# Patient Record
Sex: Male | Born: 1940 | Race: White | Hispanic: No | Smoking: Current every day smoker
Health system: Southern US, Community
[De-identification: ages and names within clinical notes are randomized; demographics above are authoritative.]

## PROBLEM LIST (undated history)

## (undated) DIAGNOSIS — E119 Type 2 diabetes mellitus without complications: Secondary | ICD-10-CM

## (undated) DIAGNOSIS — C801 Malignant (primary) neoplasm, unspecified: Secondary | ICD-10-CM

## (undated) DIAGNOSIS — K219 Gastro-esophageal reflux disease without esophagitis: Secondary | ICD-10-CM

## (undated) DIAGNOSIS — K8020 Calculus of gallbladder without cholecystitis without obstruction: Secondary | ICD-10-CM

## (undated) DIAGNOSIS — Z87442 Personal history of urinary calculi: Secondary | ICD-10-CM

## (undated) DIAGNOSIS — E785 Hyperlipidemia, unspecified: Secondary | ICD-10-CM

## (undated) DIAGNOSIS — M1990 Unspecified osteoarthritis, unspecified site: Secondary | ICD-10-CM

## (undated) DIAGNOSIS — I2510 Atherosclerotic heart disease of native coronary artery without angina pectoris: Secondary | ICD-10-CM

## (undated) DIAGNOSIS — I10 Essential (primary) hypertension: Secondary | ICD-10-CM

## (undated) HISTORY — PX: EXTRACORPOREAL SHOCK WAVE LITHOTRIPSY: SHX1557

## (undated) HISTORY — PX: CARDIAC CATHETERIZATION: SHX172

---

## 1991-02-25 HISTORY — PX: CORONARY ANGIOPLASTY: SHX604

## 1991-02-25 HISTORY — PX: CORONARY ARTERY BYPASS GRAFT: SHX141

## 2006-01-01 ENCOUNTER — Ambulatory Visit (HOSPITAL_COMMUNITY): Admission: RE | Admit: 2006-01-01 | Discharge: 2006-01-02 | Payer: Self-pay | Admitting: Orthopedic Surgery

## 2006-02-24 HISTORY — PX: OTHER SURGICAL HISTORY: SHX169

## 2007-02-25 HISTORY — PX: OTHER SURGICAL HISTORY: SHX169

## 2010-10-30 DIAGNOSIS — Z9862 Peripheral vascular angioplasty status: Secondary | ICD-10-CM | POA: Insufficient documentation

## 2011-02-28 DIAGNOSIS — E1142 Type 2 diabetes mellitus with diabetic polyneuropathy: Secondary | ICD-10-CM | POA: Insufficient documentation

## 2012-10-06 DIAGNOSIS — I209 Angina pectoris, unspecified: Secondary | ICD-10-CM | POA: Insufficient documentation

## 2015-10-18 ENCOUNTER — Other Ambulatory Visit: Payer: Self-pay | Admitting: Surgical

## 2015-10-18 MED ORDER — BUPIVACAINE LIPOSOME 1.3 % IJ SUSP: 20.0000 mL | Freq: Once | INTRAMUSCULAR | Status: AC

## 2015-11-22 ENCOUNTER — Encounter (HOSPITAL_COMMUNITY): Payer: Self-pay

## 2015-11-22 ENCOUNTER — Encounter (HOSPITAL_COMMUNITY)
Admission: RE | Admit: 2015-11-22 | Discharge: 2015-11-22 | Disposition: A | Discharge status: Home / Self Care | Payer: Medicare Other | Source: Ambulatory Visit | Attending: Orthopedic Surgery | Admitting: Orthopedic Surgery

## 2015-11-22 DIAGNOSIS — I10 Essential (primary) hypertension: Secondary | ICD-10-CM | POA: Insufficient documentation

## 2015-11-22 DIAGNOSIS — E785 Hyperlipidemia, unspecified: Secondary | ICD-10-CM | POA: Insufficient documentation

## 2015-11-22 DIAGNOSIS — E119 Type 2 diabetes mellitus without complications: Secondary | ICD-10-CM | POA: Diagnosis not present

## 2015-11-22 DIAGNOSIS — Z951 Presence of aortocoronary bypass graft: Secondary | ICD-10-CM | POA: Insufficient documentation

## 2015-11-22 DIAGNOSIS — Z955 Presence of coronary angioplasty implant and graft: Secondary | ICD-10-CM | POA: Insufficient documentation

## 2015-11-22 DIAGNOSIS — K219 Gastro-esophageal reflux disease without esophagitis: Secondary | ICD-10-CM | POA: Diagnosis not present

## 2015-11-22 DIAGNOSIS — Z01812 Encounter for preprocedural laboratory examination: Secondary | ICD-10-CM | POA: Diagnosis not present

## 2015-11-22 DIAGNOSIS — I2510 Atherosclerotic heart disease of native coronary artery without angina pectoris: Secondary | ICD-10-CM | POA: Insufficient documentation

## 2015-11-22 DIAGNOSIS — Z87442 Personal history of urinary calculi: Secondary | ICD-10-CM | POA: Diagnosis not present

## 2015-11-22 HISTORY — DX: Gastro-esophageal reflux disease without esophagitis: K21.9

## 2015-11-22 HISTORY — DX: Calculus of gallbladder without cholecystitis without obstruction: K80.20

## 2015-11-22 HISTORY — DX: Personal history of urinary calculi: Z87.442

## 2015-11-22 HISTORY — DX: Unspecified osteoarthritis, unspecified site: M19.90

## 2015-11-22 HISTORY — DX: Malignant (primary) neoplasm, unspecified: C80.1

## 2015-11-22 HISTORY — DX: Type 2 diabetes mellitus without complications: E11.9

## 2015-11-22 HISTORY — DX: Essential (primary) hypertension: I10

## 2015-11-22 HISTORY — DX: Hyperlipidemia, unspecified: E78.5

## 2015-11-22 HISTORY — DX: Atherosclerotic heart disease of native coronary artery without angina pectoris: I25.10

## 2015-11-22 LAB — COMPREHENSIVE METABOLIC PANEL
ALT: 19 U/L (ref 17–63)
AST: 23 U/L (ref 15–41)
Albumin: 4.5 g/dL (ref 3.5–5.0)
Alkaline Phosphatase: 61 U/L (ref 38–126)
Anion gap: 7 (ref 5–15)
BUN: 9 mg/dL (ref 6–20)
CO2: 27 mmol/L (ref 22–32)
Calcium: 9.8 mg/dL (ref 8.9–10.3)
Chloride: 105 mmol/L (ref 101–111)
Creatinine, Ser: 0.63 mg/dL (ref 0.61–1.24)
GFR calc Af Amer: >60 mL/min (ref >60)
GFR calc non Af Amer: >60 mL/min (ref >60)
Glucose, Bld: 101 mg/dL — ABNORMAL HIGH (ref 65–99)
Potassium: 4.8 mmol/L (ref 3.5–5.1)
Sodium: 139 mmol/L (ref 135–145)
Total Bilirubin: 0.7 mg/dL (ref 0.3–1.2)
Total Protein: 7.7 g/dL (ref 6.5–8.1)

## 2015-11-22 LAB — CBC WITH DIFFERENTIAL/PLATELET
Basophils Absolute: 0 10*3/uL (ref 0.0–0.1)
Basophils Relative: 0 %
Eosinophils Absolute: 0.1 10*3/uL (ref 0.0–0.7)
Eosinophils Relative: 1 %
HCT: 40.1 % (ref 39.0–52.0)
Hemoglobin: 13.9 g/dL (ref 13.0–17.0)
Lymphocytes Relative: 20 %
Lymphs Abs: 1.7 10*3/uL (ref 0.7–4.0)
MCH: 31.4 pg (ref 26.0–34.0)
MCHC: 34.7 g/dL (ref 30.0–36.0)
MCV: 90.7 fL (ref 78.0–100.0)
Monocytes Absolute: 1.4 10*3/uL — ABNORMAL HIGH (ref 0.1–1.0)
Monocytes Relative: 16 %
Neutro Abs: 5.3 10*3/uL (ref 1.7–7.7)
Neutrophils Relative %: 63 %
Platelets: 186 10*3/uL (ref 150–400)
RBC: 4.42 MIL/uL (ref 4.22–5.81)
RDW: 15.8 % — ABNORMAL HIGH (ref 11.5–15.5)
WBC: 8.5 10*3/uL (ref 4.0–10.5)

## 2015-11-22 LAB — URINE MICROSCOPIC-ADD ON
Bacteria, UA: NONE SEEN
RBC / HPF: NONE SEEN RBC/hpf (ref 0–5)

## 2015-11-22 LAB — URINALYSIS, ROUTINE W REFLEX MICROSCOPIC
Bilirubin Urine: NEGATIVE
Glucose, UA: 100 mg/dL — AB
Hgb urine dipstick: NEGATIVE
Ketones, ur: NEGATIVE mg/dL
Nitrite: NEGATIVE
Protein, ur: NEGATIVE mg/dL
Specific Gravity, Urine: 1.012 (ref 1.005–1.030)
pH: 6 (ref 5.0–8.0)

## 2015-11-22 LAB — PROTIME-INR
INR: 0.97
Prothrombin Time: 12.9 seconds (ref 11.4–15.2)

## 2015-11-22 LAB — ABO/RH: ABO/RH(D): A POS

## 2015-11-22 LAB — APTT: aPTT: 30 seconds (ref 24–36)

## 2015-11-22 NOTE — Progress Notes (Addendum)
EKG 04-26-15 [facility] ON CHART  STRESS TEST 11-09-15 [facility] ON CHART  LOV DR  POWERS CARDIO 04-26-15 ON CHART  CARDIAC CLEARANCE NOTE DR POWERS 10-15-15 ON CHART ECHO 11-09-14 [facility] ON CHART

## 2015-11-22 NOTE — Patient Instructions (Addendum)
Carl Sutton  11/22/2015   Your procedure is scheduled on: 11-28-15  Report to [facility] Main  Entrance take [facility]  elevators to 3rd floor to  [place] at 630  AM.  Call this number if you have problems the morning of surgery [phone]   Remember: ONLY 1 PERSON MAY GO WITH YOU TO SHORT STAY TO GET  READY MORNING OF [name].  Do not eat food or drink liquids :After Midnight.     Take these medicines the morning of surgery with A SIP OF WATER:  METOPROLOL TARTRATE, OMEPRAZOLE (PRILOSEC), TAKE 1/2 DOSE PM LEVEMIR INSULIN ON 11-27-15 DO NOT TAKE ANY DIABETIC MEDICATIONS DAY OF YOUR SURGERY                               You may not have any metal on your body including hair pins and              piercings  Do not wear jewelry, make-up, lotions, powders or perfumes, deodorant             Do not wear nail polish.  Do not shave  48 hours prior to surgery.              Men may shave face and neck.   Do not bring valuables to the hospital. [place].  Contacts, dentures or bridgework may not be worn into surgery.  Leave suitcase in the car. After surgery it may be brought to your room.                  Please read over the following fact sheets you were given: _____________________________________________________________________             [facility] - Preparing for Surgery Before surgery, you can play an important role.  Because skin is not sterile, your skin needs to be as free of germs as possible.  You can reduce the number of germs on your skin by washing with CHG (chlorahexidine gluconate) soap before surgery.  CHG is an antiseptic cleaner which kills germs and bonds with the skin to continue killing germs even after washing. Please DO NOT use if you have an allergy to CHG or antibacterial soaps.  If your skin becomes reddened/irritated stop using the CHG and inform your nurse when you  arrive at Short Stay. Do not shave (including legs and underarms) for at least 48 hours prior to the first CHG shower.  You may shave your face/neck. Please follow these instructions carefully:  1.  Shower with CHG Soap the night before surgery and the  morning of Surgery.  2.  If you choose to wash your hair, wash your hair first as usual with your  normal  shampoo.  3.  After you shampoo, rinse your hair and body thoroughly to remove the  shampoo.                           4.  Use CHG as you would any other liquid soap.  You can apply chg directly  to the skin and wash  Gently with a scrungie or clean washcloth.  5.  Apply the CHG Soap to your body ONLY FROM THE NECK DOWN.   Do not use on face/ open                           Wound or open sores. Avoid contact with eyes, ears mouth and genitals (private parts).                       Wash face,  Genitals (private parts) with your normal soap.             6.  Wash thoroughly, paying special attention to the area where your surgery  will be performed.  7.  Thoroughly rinse your body with warm water from the neck down.  8.  DO NOT shower/wash with your normal soap after using and rinsing off  the CHG Soap.                9.  Pat yourself dry with a clean towel.            10.  Wear clean pajamas.            11.  Place clean sheets on your bed the night of your first shower and do not  sleep with pets. Day of Surgery : Do not apply any lotions/deodorants the morning of surgery.  Please wear clean clothes to the hospital/surgery center.  FAILURE TO FOLLOW THESE INSTRUCTIONS MAY RESULT IN THE CANCELLATION OF YOUR SURGERY PATIENT SIGNATURE_________________________________  NURSE SIGNATURE__________________________________  ________________________________________________________________________   [name]  An incentive spirometer is a tool that can help keep your lungs clear and active. This tool measures how  well you are filling your lungs with each breath. Taking long deep breaths may help reverse or decrease the chance of developing breathing (pulmonary) problems (especially infection) following:  A long period of time when you are unable to move or be active. BEFORE THE PROCEDURE   If the spirometer includes an indicator to show your best effort, your nurse or respiratory therapist will set it to a desired goal.  If possible, sit up straight or lean slightly forward. Try not to slouch.  Hold the incentive spirometer in an upright position. INSTRUCTIONS FOR USE  1. Sit on the edge of your bed if possible, or sit up as far as you can in bed or on a chair. 2. Hold the incentive spirometer in an upright position. 3. Breathe out normally. 4. Place the mouthpiece in your mouth and seal your lips tightly around it. 5. Breathe in slowly and as deeply as possible, raising the piston or the ball toward the top of the column. 6. Hold your breath for 3-5 seconds or for as long as possible. Allow the piston or ball to fall to the bottom of the column. 7. Remove the mouthpiece from your mouth and breathe out normally. 8. Rest for a few seconds and repeat Steps 1 through 7 at least 10 times every 1-2 hours when you are awake. Take your time and take a few normal breaths between deep breaths. 9. The spirometer may include an indicator to show your best effort. Use the indicator as a goal to work toward during each repetition. 10. After each set of 10 deep breaths, practice coughing to be sure your lungs are clear. If you have an incision (the cut made at the time of surgery),  support your incision when coughing by placing a pillow or rolled up towels firmly against it. Once you are able to get out of bed, walk around indoors and cough well. You may stop using the incentive spirometer when instructed by your caregiver.  RISKS AND COMPLICATIONS  Take your time so you do not get dizzy or light-headed.  If you  are in pain, you may need to take or ask for pain medication before doing incentive spirometry. It is harder to take a deep breath if you are having pain. AFTER USE  Rest and breathe slowly and easily.  It can be helpful to keep track of a log of your progress. Your caregiver can provide you with a simple table to help with this. If you are using the spirometer at home, follow these instructions: [place] IF:   You are having difficultly using the spirometer.  You have trouble using the spirometer as often as instructed.  Your pain medication is not giving enough relief while using the spirometer.  You develop fever of 100.5 F (38.1 C) or higher. SEEK IMMEDIATE MEDICAL CARE IF:   You cough up bloody sputum that had not been present before.  You develop fever of 102 F (38.9 C) or greater.  You develop worsening pain at or near the incision site. MAKE SURE YOU:   Understand these instructions.  Will watch your condition.  Will get help right away if you are not doing well or get worse. Document Released: 06/23/2006 Document Revised: 05/05/2011 Document Reviewed: 08/24/2006 ExitCare Patient Information 2014 ExitCare, [place].   ________________________________________________________________________  WHAT IS A BLOOD TRANSFUSION? Blood Transfusion Information  A transfusion is the replacement of blood or some of its parts. Blood is made up of multiple cells which provide different functions.  Red blood cells carry oxygen and are used for blood loss replacement.  White blood cells fight against infection.  Platelets control bleeding.  Plasma helps clot blood.  Other blood products are available for specialized needs, such as hemophilia or other clotting disorders. BEFORE THE TRANSFUSION  Who gives blood for transfusions?   Healthy volunteers who are fully evaluated to make sure their blood is safe. This is blood bank blood. Transfusion therapy is the safest  it has ever been in the practice of medicine. Before blood is taken from a donor, a complete history is taken to make sure that person has no history of diseases nor engages in risky social behavior (examples are intravenous drug use or sexual activity with multiple partners). The donor's travel history is screened to minimize risk of transmitting infections, such as malaria. The donated blood is tested for signs of infectious diseases, such as HIV and hepatitis. The blood is then tested to be sure it is compatible with you in order to minimize the chance of a transfusion reaction. If you or a relative donates blood, this is often done in anticipation of surgery and is not appropriate for emergency situations. It takes many days to process the donated blood. RISKS AND COMPLICATIONS Although transfusion therapy is very safe and saves many lives, the main dangers of transfusion include:   Getting an infectious disease.  Developing a transfusion reaction. This is an allergic reaction to something in the blood you were given. Every precaution is taken to prevent this. The decision to have a blood transfusion has been considered carefully by your caregiver before blood is given. Blood is not given unless the benefits outweigh the risks. AFTER THE TRANSFUSION  Right after receiving a blood transfusion, you will usually feel much better and more energetic. This is especially true if your red blood cells have gotten low (anemic). The transfusion raises the level of the red blood cells which carry oxygen, and this usually causes an energy increase.  The nurse administering the transfusion will monitor you carefully for complications. HOME CARE INSTRUCTIONS  No special instructions are needed after a transfusion. You may find your energy is better. Speak with your caregiver about any limitations on activity for underlying diseases you may have. SEEK MEDICAL CARE IF:   Your condition is not improving after  your transfusion.  You develop redness or irritation at the intravenous (IV) site. SEEK IMMEDIATE MEDICAL CARE IF:  Any of the following symptoms occur over the next 12 hours:  Shaking chills.  You have a temperature by mouth above 102 F (38.9 C), not controlled by medicine.  Chest, back, or muscle pain.  People around you feel you are not acting correctly or are confused.  Shortness of breath or difficulty breathing.  Dizziness and fainting.  You get a rash or develop hives.  You have a decrease in urine output.  Your urine turns a dark color or changes to pink, red, or brown. Any of the following symptoms occur over the next 10 days:  You have a temperature by mouth above 102 F (38.9 C), not controlled by medicine.  Shortness of breath.  Weakness after normal activity.  The white part of the eye turns yellow (jaundice).  You have a decrease in the amount of urine or are urinating less often.  Your urine turns a dark color or changes to pink, red, or brown. Document Released: 02/08/2000 Document Revised: 05/05/2011 Document Reviewed: 09/27/2007 [facility] Patient Information 2014 [place], [place].  _______________________________________________________________________

## 2015-11-23 ENCOUNTER — Other Ambulatory Visit (HOSPITAL_COMMUNITY): Payer: Self-pay | Admitting: *Deleted

## 2015-11-23 LAB — HEMOGLOBIN A1C
Hgb A1c MFr Bld: 6.5 % — ABNORMAL HIGH (ref 4.8–5.6)
Mean Plasma Glucose: 140 mg/dL

## 2015-11-23 LAB — SURGICAL PCR SCREEN
MRSA, PCR: NEGATIVE
Staphylococcus aureus: NEGATIVE

## 2015-11-27 NOTE — H&P (Signed)
TOTAL KNEE ADMISSION H&P  Patient is being admitted for right total knee arthroplasty.  Subjective:  Chief Complaint:right knee pain.  HPI: Carl Sutton, 75 y.o. male, has a history of pain and functional disability in the right knee due to arthritis and has failed non-surgical conservative treatments for greater than 12 weeks to includeNSAID's and/or analgesics, corticosteriod injections, viscosupplementation injections, weight reduction as appropriate and activity modification.  Onset of symptoms was gradual, starting 5 years ago with gradually worsening course since that time. The patient noted prior procedures on the knee to include  arthroscopy and menisectomy on the right knee(s).  Patient currently rates pain in the right knee(s) at 8 out of 10 with activity. Patient has night pain, worsening of pain with activity and weight bearing, pain that interferes with activities of daily living, pain with passive range of motion, crepitus and joint swelling.  Patient has evidence of periarticular osteophytes and joint space narrowing by imaging studies. There is no active infection.   Past Medical History:  Diagnosis Date  . Arthritis    oa  . Cancer ([place])    skin cancer area removed basal cell  . Coronary artery disease   . Diabetes mellitus without complication ([place])   . Gallstones   . GERD (gastroesophageal reflux disease)   . History of kidney stones yrs ago  . Hyperlipidemia   . Hypertension     Past Surgical History:  Procedure Laterality Date  . CARDIAC CATHETERIZATION  last 2009  . CORONARY ANGIOPLASTY  1993   with stents done  . CORONARY ARTERY BYPASS GRAFT  1993   x 3  . EXTRACORPOREAL SHOCK WAVE LITHOTRIPSY  4-5- yrs ago  . right rotator cuff repair  2008  . stent to heart   2009   rca    Current Outpatient Prescriptions:  .  aspirin 325 MG tablet, Take 325 mg by mouth daily., Disp: , Rfl:  .  atorvastatin (LIPITOR) 40 MG tablet, Take 40 mg by mouth every  evening. , Disp: , Rfl:  .  Cholecalciferol (VITAMIN D) 2000 units CAPS, Take 2,000 Units by mouth daily., Disp: , Rfl:  .  furosemide (LASIX) 40 MG tablet, Take 40 mg by mouth daily as needed., Disp: , Rfl:  .  Garlic 123XX123 MG CAPS, Take 1,000 mg by mouth 3 (three) times daily., Disp: , Rfl:  .  HUMALOG KWIKPEN 100 UNIT/ML KiwkPen, Inject 15 Units into the skin as needed (for high blood sugar). , Disp: , Rfl:  .  Insulin Detemir (LEVEMIR FLEXPEN) 100 UNIT/ML Pen, Inject 35 Units into the skin 2 (two) times daily., Disp: , Rfl:  .  lisinopril (PRINIVIL,ZESTRIL) 10 MG tablet, Take 10 mg by mouth daily., Disp: , Rfl: 1 .  metFORMIN (GLUCOPHAGE) 1000 MG tablet, Take 1,000 mg by mouth 2 (two) times daily., Disp: , Rfl:  .  metoprolol tartrate (LOPRESSOR) 25 MG tablet, Take 25 mg by mouth 2 (two) times daily., Disp: , Rfl:  .  Multiple Vitamins-Minerals (MULTIVITAMIN PO), Take 1 tablet by mouth daily., Disp: , Rfl:  .  omeprazole (PRILOSEC) 40 MG capsule, Take 40 mg by mouth daily., Disp: , Rfl: 1  Facility-Administered Medications Ordered in Other Encounters:  .  bupivacaine liposome (EXPAREL) 1.3 % injection 266 mg, 20 mL, Infiltration, Once, [employer], PA-C  No Known Allergies  Social History  Substance Use Topics  . Smoking status: Current Every Day Smoker    Packs/day: 1.00    Years: 45.00  Types: Cigarettes  . Smokeless tobacco: Never Used  . Alcohol use No      Review of Systems  Constitutional: Negative.   HENT: Negative.   Eyes: Negative.   Respiratory: Negative.   Cardiovascular: Negative.   Gastrointestinal: Negative.   Genitourinary: Positive for frequency. Negative for dysuria, flank pain, hematuria and urgency.  Musculoskeletal: Positive for back pain and myalgias. Negative for falls, joint pain and neck pain.  Skin: Negative.   Neurological: Negative.   Endo/Heme/Allergies: Negative.   Psychiatric/Behavioral: Negative.     Objective:  Physical Exam   Constitutional: He is oriented to person, place, and time. He appears well-developed. No distress.  Obese  HENT:  Head: Normocephalic and atraumatic.  Right Ear: External ear normal.  Left Ear: External ear normal.  Nose: Nose normal.  Mouth/Throat: Oropharynx is clear and moist.  Eyes: Conjunctivae and EOM are normal.  Neck: Normal range of motion. Neck supple.  Cardiovascular: Normal rate, regular rhythm and intact distal pulses.   Murmur heard.  Systolic murmur is present with a grade of 3/6  Respiratory: Effort normal and breath sounds normal. No respiratory distress. He has no wheezes.  GI: Soft. Bowel sounds are normal. He exhibits no distension. There is no tenderness.  Musculoskeletal:       Right hip: Normal.       Left hip: Normal.       Right knee: He exhibits decreased range of motion and swelling. He exhibits no effusion and no erythema. Tenderness found. Medial joint line and lateral joint line tenderness noted.       Left knee: Normal.  Neurological: He is alert and oriented to person, place, and time. He has normal strength. No sensory deficit.  Skin: No rash noted. He is not diaphoretic. No erythema.  Psychiatric: He has a normal mood and affect. His behavior is normal.    Vitals Weight: 200 lb Height: 67in Body Surface Area: 2.02 m Body Mass Index: 31.32 kg/m  Pulse: 76 (Regular)  BP: 138/80 (Sitting, Left Arm, Standard)   Imaging Review Plain radiographs demonstrate severe degenerative joint disease of the right knee(s). The overall alignment ismild varus. The bone quality appears to be good for age and reported activity level.  Assessment/Plan:  End stage primary osteoarthritis, right knee   The patient history, physical examination, clinical judgment of the provider and imaging studies are consistent with end stage degenerative joint disease of the right knee(s) and total knee arthroplasty is deemed medically necessary. The treatment options  including medical management, injection therapy arthroscopy and arthroplasty were discussed at length. The risks and benefits of total knee arthroplasty were presented and reviewed. The risks due to aseptic loosening, infection, stiffness, patella tracking problems, thromboembolic complications and other imponderables were discussed. The patient acknowledged the explanation, agreed to proceed with the plan and consent was signed. Patient is being admitted for inpatient treatment for surgery, pain control, PT, OT, prophylactic antibiotics, VTE prophylaxis, progressive ambulation and ADL's and discharge planning. The patient is planning to be discharged home with home health services   PCP: [facility] (has not been to recently) Cardio: Dr. Valetta Fuller Endocrine: Dr. Susette Racer Therapy Plans: HHPT then outpatient at [facility] alone with neightbor prepared to help   Ardeen Jourdain, PA-C

## 2015-11-28 ENCOUNTER — Inpatient Hospital Stay (HOSPITAL_COMMUNITY)
Admission: RE | Admit: 2015-11-28 | Discharge: 2015-11-30 | DRG: 470 | Disposition: A | Discharge status: Home Health | Payer: Medicare Other | Source: Ambulatory Visit | Attending: Orthopedic Surgery | Admitting: Orthopedic Surgery

## 2015-11-28 ENCOUNTER — Inpatient Hospital Stay (HOSPITAL_COMMUNITY): Payer: Medicare Other | Admitting: Certified Registered Nurse Anesthetist

## 2015-11-28 ENCOUNTER — Encounter (HOSPITAL_COMMUNITY): Payer: Self-pay | Admitting: *Deleted

## 2015-11-28 ENCOUNTER — Encounter (HOSPITAL_COMMUNITY)
Admission: RE | Disposition: A | Discharge status: Home Health | Payer: Self-pay | Source: Ambulatory Visit | Attending: Orthopedic Surgery

## 2015-11-28 DIAGNOSIS — E785 Hyperlipidemia, unspecified: Secondary | ICD-10-CM | POA: Diagnosis present

## 2015-11-28 DIAGNOSIS — Z794 Long term (current) use of insulin: Secondary | ICD-10-CM | POA: Diagnosis not present

## 2015-11-28 DIAGNOSIS — I2510 Atherosclerotic heart disease of native coronary artery without angina pectoris: Secondary | ICD-10-CM | POA: Diagnosis present

## 2015-11-28 DIAGNOSIS — F17210 Nicotine dependence, cigarettes, uncomplicated: Secondary | ICD-10-CM | POA: Diagnosis present

## 2015-11-28 DIAGNOSIS — Z85828 Personal history of other malignant neoplasm of skin: Secondary | ICD-10-CM | POA: Diagnosis not present

## 2015-11-28 DIAGNOSIS — Z7982 Long term (current) use of aspirin: Secondary | ICD-10-CM | POA: Diagnosis not present

## 2015-11-28 DIAGNOSIS — Z951 Presence of aortocoronary bypass graft: Secondary | ICD-10-CM

## 2015-11-28 DIAGNOSIS — M1711 Unilateral primary osteoarthritis, right knee: Secondary | ICD-10-CM | POA: Diagnosis present

## 2015-11-28 DIAGNOSIS — Z79899 Other long term (current) drug therapy: Secondary | ICD-10-CM | POA: Diagnosis not present

## 2015-11-28 DIAGNOSIS — K219 Gastro-esophageal reflux disease without esophagitis: Secondary | ICD-10-CM | POA: Diagnosis present

## 2015-11-28 DIAGNOSIS — E119 Type 2 diabetes mellitus without complications: Secondary | ICD-10-CM | POA: Diagnosis present

## 2015-11-28 DIAGNOSIS — I10 Essential (primary) hypertension: Secondary | ICD-10-CM | POA: Diagnosis present

## 2015-11-28 DIAGNOSIS — M24561 Contracture, right knee: Secondary | ICD-10-CM | POA: Diagnosis present

## 2015-11-28 DIAGNOSIS — Z955 Presence of coronary angioplasty implant and graft: Secondary | ICD-10-CM

## 2015-11-28 DIAGNOSIS — M25561 Pain in right knee: Secondary | ICD-10-CM | POA: Diagnosis present

## 2015-11-28 DIAGNOSIS — Z96651 Presence of right artificial knee joint: Secondary | ICD-10-CM

## 2015-11-28 DIAGNOSIS — R262 Difficulty in walking, not elsewhere classified: Secondary | ICD-10-CM

## 2015-11-28 HISTORY — PX: TOTAL KNEE ARTHROPLASTY: SHX125

## 2015-11-28 LAB — GLUCOSE, CAPILLARY
GLUCOSE-CAPILLARY: 199 mg/dL — AB (ref 65–99)
Glucose-Capillary: 200 mg/dL — ABNORMAL HIGH (ref 65–99)
Glucose-Capillary: 185 mg/dL — ABNORMAL HIGH (ref 65–99)
Glucose-Capillary: 162 mg/dL — ABNORMAL HIGH (ref 65–99)

## 2015-11-28 LAB — TYPE AND SCREEN
ABO/RH(D): A POS
Antibody Screen: NEGATIVE

## 2015-11-28 SURGERY — ARTHROPLASTY, KNEE, TOTAL
Anesthesia: General | Site: Knee | Laterality: Right

## 2015-11-28 MED ORDER — ROCURONIUM BROMIDE 100 MG/10ML IV SOLN
INTRAVENOUS | Status: DC | PRN
  Administered 2015-11-28: 30 mg via INTRAVENOUS

## 2015-11-28 MED ORDER — METOPROLOL TARTRATE 25 MG PO TABS
25.0000 mg | ORAL_TABLET | Freq: Two times a day (BID) | ORAL | Status: DC
  Administered 2015-11-28 – 2015-11-30 (×4): 25 mg via ORAL
  Filled 2015-11-28 (×4): qty 1

## 2015-11-28 MED ORDER — ACETAMINOPHEN 650 MG RE SUPP: 650.0000 mg | Freq: Four times a day (QID) | RECTAL | Status: DC | PRN

## 2015-11-28 MED ORDER — DEXAMETHASONE SODIUM PHOSPHATE 10 MG/ML IJ SOLN
INTRAMUSCULAR | Status: AC
  Filled 2015-11-28: qty 1

## 2015-11-28 MED ORDER — FUROSEMIDE 40 MG PO TABS: 40.0000 mg | ORAL_TABLET | Freq: Every day | ORAL | Status: DC | PRN

## 2015-11-28 MED ORDER — ONDANSETRON HCL 4 MG/2ML IJ SOLN
INTRAMUSCULAR | Status: AC
  Filled 2015-11-28: qty 2

## 2015-11-28 MED ORDER — SUGAMMADEX SODIUM 200 MG/2ML IV SOLN
INTRAVENOUS | Status: AC
  Filled 2015-11-28: qty 2

## 2015-11-28 MED ORDER — CHLORHEXIDINE GLUCONATE 4 % EX LIQD: 60.0000 mL | Freq: Once | CUTANEOUS | Status: DC

## 2015-11-28 MED ORDER — CEFAZOLIN IN D5W 1 GM/50ML IV SOLN
1.0000 g | Freq: Four times a day (QID) | INTRAVENOUS | Status: AC
  Administered 2015-11-28 (×2): 1 g via INTRAVENOUS
  Filled 2015-11-28 (×2): qty 50

## 2015-11-28 MED ORDER — OXYCODONE-ACETAMINOPHEN 5-325 MG PO TABS
2.0000 | ORAL_TABLET | ORAL | Status: DC | PRN
  Administered 2015-11-28 – 2015-11-30 (×7): 2 via ORAL
  Filled 2015-11-28 (×7): qty 2

## 2015-11-28 MED ORDER — ONDANSETRON HCL 4 MG/2ML IJ SOLN: 4.0000 mg | Freq: Four times a day (QID) | INTRAMUSCULAR | Status: DC | PRN

## 2015-11-28 MED ORDER — LABETALOL HCL 5 MG/ML IV SOLN
INTRAVENOUS | Status: DC | PRN
  Administered 2015-11-28 (×2): 5 mg via INTRAVENOUS

## 2015-11-28 MED ORDER — ACETAMINOPHEN 325 MG PO TABS: 650.0000 mg | ORAL_TABLET | Freq: Four times a day (QID) | ORAL | Status: DC | PRN

## 2015-11-28 MED ORDER — RIVAROXABAN 10 MG PO TABS
10.0000 mg | ORAL_TABLET | Freq: Every day | ORAL | Status: DC
  Administered 2015-11-29 – 2015-11-30 (×2): 10 mg via ORAL
  Filled 2015-11-28 (×2): qty 1

## 2015-11-28 MED ORDER — PANTOPRAZOLE SODIUM 40 MG PO TBEC
40.0000 mg | DELAYED_RELEASE_TABLET | Freq: Every day | ORAL | Status: DC
  Administered 2015-11-29 – 2015-11-30 (×2): 40 mg via ORAL
  Filled 2015-11-28 (×2): qty 1

## 2015-11-28 MED ORDER — FENTANYL CITRATE (PF) 100 MCG/2ML IJ SOLN
INTRAMUSCULAR | Status: AC
  Filled 2015-11-28: qty 2

## 2015-11-28 MED ORDER — TRANEXAMIC ACID 1000 MG/10ML IV SOLN
1000.0000 mg | INTRAVENOUS | Status: AC
  Administered 2015-11-28: 1000 mg via INTRAVENOUS
  Filled 2015-11-28: qty 10

## 2015-11-28 MED ORDER — CELECOXIB 200 MG PO CAPS
200.0000 mg | ORAL_CAPSULE | Freq: Two times a day (BID) | ORAL | Status: DC
  Administered 2015-11-28 – 2015-11-30 (×5): 200 mg via ORAL
  Filled 2015-11-28 (×5): qty 1

## 2015-11-28 MED ORDER — LACTATED RINGERS IV SOLN
INTRAVENOUS | Status: DC
  Administered 2015-11-28: 08:00:00 via INTRAVENOUS

## 2015-11-28 MED ORDER — ATORVASTATIN CALCIUM 20 MG PO TABS
40.0000 mg | ORAL_TABLET | Freq: Every evening | ORAL | Status: DC
  Administered 2015-11-28 – 2015-11-29 (×2): 40 mg via ORAL
  Filled 2015-11-28 (×2): qty 2

## 2015-11-28 MED ORDER — SUGAMMADEX SODIUM 200 MG/2ML IV SOLN
INTRAVENOUS | Status: DC | PRN
  Administered 2015-11-28: 200 mg via INTRAVENOUS

## 2015-11-28 MED ORDER — BUPIVACAINE HCL (PF) 0.25 % IJ SOLN
INTRAMUSCULAR | Status: DC | PRN
  Administered 2015-11-28: 20 mL

## 2015-11-28 MED ORDER — CEFAZOLIN SODIUM-DEXTROSE 2-4 GM/100ML-% IV SOLN
INTRAVENOUS | Status: AC
  Filled 2015-11-28: qty 100

## 2015-11-28 MED ORDER — MIDAZOLAM HCL 2 MG/2ML IJ SOLN
INTRAMUSCULAR | Status: AC
  Filled 2015-11-28: qty 2

## 2015-11-28 MED ORDER — STERILE WATER FOR IRRIGATION IR SOLN
Status: DC | PRN
Start: 2015-11-28 — End: 2015-11-28
  Administered 2015-11-28: 1000 mL

## 2015-11-28 MED ORDER — SODIUM CHLORIDE 0.9 % IJ SOLN
INTRAMUSCULAR | Status: DC | PRN
  Administered 2015-11-28: 20 mL

## 2015-11-28 MED ORDER — PROPOFOL 10 MG/ML IV BOLUS
INTRAVENOUS | Status: DC | PRN
  Administered 2015-11-28: 150 mg via INTRAVENOUS

## 2015-11-28 MED ORDER — LIDOCAINE 2% (20 MG/ML) 5 ML SYRINGE
INTRAMUSCULAR | Status: AC
  Filled 2015-11-28: qty 5

## 2015-11-28 MED ORDER — ONDANSETRON HCL 4 MG PO TABS
4.0000 mg | ORAL_TABLET | Freq: Four times a day (QID) | ORAL | Status: DC | PRN
Start: 2015-11-28 — End: 2015-11-30

## 2015-11-28 MED ORDER — LIDOCAINE HCL (CARDIAC) 20 MG/ML IV SOLN
INTRAVENOUS | Status: DC | PRN
  Administered 2015-11-28: 100 mg via INTRAVENOUS

## 2015-11-28 MED ORDER — SODIUM CHLORIDE 0.9 % IJ SOLN
INTRAMUSCULAR | Status: AC
  Filled 2015-11-28: qty 50

## 2015-11-28 MED ORDER — SODIUM CHLORIDE 0.9 % IR SOLN
Status: AC
  Filled 2015-11-28: qty 500000

## 2015-11-28 MED ORDER — HYDROMORPHONE HCL 2 MG/ML IJ SOLN
INTRAMUSCULAR | Status: AC
  Filled 2015-11-28: qty 1

## 2015-11-28 MED ORDER — MENTHOL 3 MG MT LOZG: 1.0000 | LOZENGE | OROMUCOSAL | Status: DC | PRN

## 2015-11-28 MED ORDER — ONDANSETRON HCL 4 MG/2ML IJ SOLN
INTRAMUSCULAR | Status: DC | PRN
  Administered 2015-11-28: 4 mg via INTRAVENOUS

## 2015-11-28 MED ORDER — METOCLOPRAMIDE HCL 5 MG/ML IJ SOLN: 10.0000 mg | Freq: Once | INTRAMUSCULAR | Status: DC | PRN

## 2015-11-28 MED ORDER — SODIUM CHLORIDE 0.9 % IR SOLN
Status: DC | PRN
  Administered 2015-11-28: 1000 mL

## 2015-11-28 MED ORDER — ALUM & MAG HYDROXIDE-SIMETH 200-200-20 MG/5ML PO SUSP: 30.0000 mL | ORAL | Status: DC | PRN

## 2015-11-28 MED ORDER — LABETALOL HCL 5 MG/ML IV SOLN
INTRAVENOUS | Status: AC
  Filled 2015-11-28: qty 4

## 2015-11-28 MED ORDER — POLYETHYLENE GLYCOL 3350 17 G PO PACK: 17.0000 g | PACK | Freq: Every day | ORAL | Status: DC | PRN

## 2015-11-28 MED ORDER — FENTANYL CITRATE (PF) 100 MCG/2ML IJ SOLN
25.0000 ug | INTRAMUSCULAR | Status: DC | PRN
  Administered 2015-11-28: 25 ug via INTRAVENOUS

## 2015-11-28 MED ORDER — FENTANYL CITRATE (PF) 100 MCG/2ML IJ SOLN
INTRAMUSCULAR | Status: DC | PRN
Start: 2015-11-28 — End: 2015-11-28
  Administered 2015-11-28 (×2): 50 ug via INTRAVENOUS
  Administered 2015-11-28: 100 ug via INTRAVENOUS

## 2015-11-28 MED ORDER — HYDROMORPHONE HCL 1 MG/ML IJ SOLN
INTRAMUSCULAR | Status: DC | PRN
  Administered 2015-11-28 (×4): 0.5 mg via INTRAVENOUS

## 2015-11-28 MED ORDER — METHOCARBAMOL 1000 MG/10ML IJ SOLN
500.0000 mg | Freq: Four times a day (QID) | INTRAMUSCULAR | Status: DC | PRN
  Administered 2015-11-28: 500 mg via INTRAVENOUS
  Filled 2015-11-28: qty 550
  Filled 2015-11-28: qty 5

## 2015-11-28 MED ORDER — METHOCARBAMOL 500 MG PO TABS
500.0000 mg | ORAL_TABLET | Freq: Four times a day (QID) | ORAL | Status: DC | PRN
  Administered 2015-11-29 – 2015-11-30 (×5): 500 mg via ORAL
  Filled 2015-11-28 (×5): qty 1

## 2015-11-28 MED ORDER — INSULIN ASPART 100 UNIT/ML ~~LOC~~ SOLN
0.0000 [IU] | Freq: Three times a day (TID) | SUBCUTANEOUS | Status: DC
  Administered 2015-11-28: 3 [IU] via SUBCUTANEOUS
  Administered 2015-11-29: 5 [IU] via SUBCUTANEOUS
  Administered 2015-11-29: 3 [IU] via SUBCUTANEOUS
  Administered 2015-11-29: 5 [IU] via SUBCUTANEOUS
  Administered 2015-11-30: 3 [IU] via SUBCUTANEOUS

## 2015-11-28 MED ORDER — LISINOPRIL 10 MG PO TABS
10.0000 mg | ORAL_TABLET | Freq: Every day | ORAL | Status: DC
  Administered 2015-11-29 – 2015-11-30 (×2): 10 mg via ORAL
  Filled 2015-11-28 (×2): qty 1

## 2015-11-28 MED ORDER — SODIUM CHLORIDE 0.9 % IR SOLN
Status: DC | PRN
  Administered 2015-11-28: 500 mL

## 2015-11-28 MED ORDER — BISACODYL 5 MG PO TBEC
5.0000 mg | DELAYED_RELEASE_TABLET | Freq: Every day | ORAL | Status: DC | PRN
  Administered 2015-11-30: 5 mg via ORAL
  Filled 2015-11-28: qty 1

## 2015-11-28 MED ORDER — FLEET ENEMA 7-19 GM/118ML RE ENEM: 1.0000 | ENEMA | Freq: Once | RECTAL | Status: DC | PRN

## 2015-11-28 MED ORDER — MEPERIDINE HCL 50 MG/ML IJ SOLN
6.2500 mg | INTRAMUSCULAR | Status: DC | PRN
Start: 2015-11-28 — End: 2015-11-28

## 2015-11-28 MED ORDER — LACTATED RINGERS IV SOLN
INTRAVENOUS | Status: DC
  Administered 2015-11-28: 100 mL/h via INTRAVENOUS
  Administered 2015-11-29: 06:00:00 via INTRAVENOUS

## 2015-11-28 MED ORDER — SUCCINYLCHOLINE CHLORIDE 20 MG/ML IJ SOLN
INTRAMUSCULAR | Status: DC | PRN
  Administered 2015-11-28: 100 mg via INTRAVENOUS

## 2015-11-28 MED ORDER — HYDROMORPHONE HCL 1 MG/ML IJ SOLN
1.0000 mg | INTRAMUSCULAR | Status: DC | PRN
Start: 2015-11-28 — End: 2015-11-30

## 2015-11-28 MED ORDER — ROCURONIUM BROMIDE 10 MG/ML (PF) SYRINGE
PREFILLED_SYRINGE | INTRAVENOUS | Status: AC
  Filled 2015-11-28: qty 10

## 2015-11-28 MED ORDER — HYDROCODONE-ACETAMINOPHEN 5-325 MG PO TABS
1.0000 | ORAL_TABLET | ORAL | Status: DC | PRN
  Administered 2015-11-28 – 2015-11-29 (×2): 2 via ORAL
  Filled 2015-11-28 (×2): qty 2

## 2015-11-28 MED ORDER — GARLIC 1000 MG PO CAPS: 1000.0000 mg | ORAL_CAPSULE | Freq: Three times a day (TID) | ORAL | Status: DC

## 2015-11-28 MED ORDER — PHENOL 1.4 % MT LIQD: 1.0000 | OROMUCOSAL | Status: DC | PRN

## 2015-11-28 MED ORDER — BUPIVACAINE LIPOSOME 1.3 % IJ SUSP
20.0000 mL | Freq: Once | INTRAMUSCULAR | Status: AC
  Administered 2015-11-28: 20 mL
  Filled 2015-11-28: qty 20

## 2015-11-28 MED ORDER — BUPIVACAINE HCL (PF) 0.25 % IJ SOLN
INTRAMUSCULAR | Status: AC
  Filled 2015-11-28: qty 30

## 2015-11-28 MED ORDER — CEFAZOLIN SODIUM-DEXTROSE 2-4 GM/100ML-% IV SOLN
2.0000 g | INTRAVENOUS | Status: AC
  Administered 2015-11-28: 2 g via INTRAVENOUS
  Filled 2015-11-28: qty 100

## 2015-11-28 MED ORDER — FERROUS SULFATE 325 (65 FE) MG PO TABS
325.0000 mg | ORAL_TABLET | Freq: Three times a day (TID) | ORAL | Status: DC
  Administered 2015-11-29 (×2): 325 mg via ORAL
  Filled 2015-11-28 (×2): qty 1

## 2015-11-28 MED ORDER — PROPOFOL 10 MG/ML IV BOLUS
INTRAVENOUS | Status: AC
  Filled 2015-11-28: qty 20

## 2015-11-28 SURGICAL SUPPLY — 63 items
BAG DECANTER FOR FLEXI CONT (MISCELLANEOUS) IMPLANT
BAG ZIPLOCK 12X15 (MISCELLANEOUS) IMPLANT
BANDAGE ACE 4X5 VEL STRL LF (GAUZE/BANDAGES/DRESSINGS) ×3 IMPLANT
BANDAGE ACE 6X5 VEL STRL LF (GAUZE/BANDAGES/DRESSINGS) ×3 IMPLANT
BLADE SAG 18X100X1.27 (BLADE) ×3 IMPLANT
BLADE SAW SGTL 11.0X1.19X90.0M (BLADE) ×3 IMPLANT
BNDG COHESIVE 4X5 TAN NS LF (GAUZE/BANDAGES/DRESSINGS) ×6 IMPLANT
BONE CEMENT GENTAMICIN (Cement) ×6 IMPLANT
CAP KNEE TOTAL 3 SIGMA ×3 IMPLANT
CEMENT BONE GENTAMICIN 40 (Cement) ×2 IMPLANT
CLOSURE WOUND 1/2 X4 (GAUZE/BANDAGES/DRESSINGS) ×2
CLOTH BEACON ORANGE TIMEOUT ST (SAFETY) ×3 IMPLANT
CUFF TOURN SGL QUICK 34 (TOURNIQUET CUFF) ×2
CUFF TRNQT CYL 34X4X40X1 (TOURNIQUET CUFF) ×1 IMPLANT
DECANTER SPIKE VIAL GLASS SM (MISCELLANEOUS) ×3 IMPLANT
DRAPE INCISE IOBAN 66X45 STRL (DRAPES) IMPLANT
DRAPE U-SHAPE 47X51 STRL (DRAPES) ×3 IMPLANT
DRSG ADAPTIC 3X8 NADH LF (GAUZE/BANDAGES/DRESSINGS) ×3 IMPLANT
DRSG PAD ABDOMINAL 8X10 ST (GAUZE/BANDAGES/DRESSINGS) ×6 IMPLANT
DURAPREP 26ML APPLICATOR (WOUND CARE) ×3 IMPLANT
ELECT REM PT RETURN 9FT ADLT (ELECTROSURGICAL) ×3
ELECTRODE REM PT RTRN 9FT ADLT (ELECTROSURGICAL) ×1 IMPLANT
EVACUATOR 1/8 PVC DRAIN (DRAIN) ×3 IMPLANT
FACESHIELD WRAPAROUND (MASK) ×3 IMPLANT
GAUZE SPONGE 4X4 12PLY STRL (GAUZE/BANDAGES/DRESSINGS) ×3 IMPLANT
GLOVE BIOGEL PI IND STRL 6.5 (GLOVE) ×1 IMPLANT
GLOVE BIOGEL PI IND STRL 8 (GLOVE) ×1 IMPLANT
GLOVE BIOGEL PI INDICATOR 6.5 (GLOVE) ×2
GLOVE BIOGEL PI INDICATOR 8 (GLOVE) ×2
GLOVE ECLIPSE 8.0 STRL XLNG CF (GLOVE) ×6 IMPLANT
GLOVE SURG SS PI 6.5 STRL IVOR (GLOVE) ×3 IMPLANT
GOWN STRL REUS W/TWL LRG LVL3 (GOWN DISPOSABLE) ×3 IMPLANT
GOWN STRL REUS W/TWL XL LVL3 (GOWN DISPOSABLE) ×3 IMPLANT
HANDPIECE INTERPULSE COAX TIP (DISPOSABLE) ×2
HEMOSTAT SPONGE AVITENE ULTRA (HEMOSTASIS) ×3 IMPLANT
IMMOBILIZER KNEE 20 (SOFTGOODS) ×3
IMMOBILIZER KNEE 20 THIGH 36 (SOFTGOODS) ×1 IMPLANT
MANIFOLD NEPTUNE II (INSTRUMENTS) ×3 IMPLANT
NEEDLE HYPO 21X1.5 SAFETY (NEEDLE) ×3 IMPLANT
NEEDLE HYPO 22GX1.5 SAFETY (NEEDLE) ×3 IMPLANT
NS IRRIG 1000ML POUR BTL (IV SOLUTION) IMPLANT
PACK TOTAL KNEE CUSTOM (KITS) ×3 IMPLANT
PAD ABD 8X10 STRL (GAUZE/BANDAGES/DRESSINGS) ×3 IMPLANT
PENCIL SMOKE EVAC W/HOLSTER (ELECTROSURGICAL) ×3 IMPLANT
POSITIONER SURGICAL ARM (MISCELLANEOUS) ×3 IMPLANT
SET HNDPC FAN SPRY TIP SCT (DISPOSABLE) ×1 IMPLANT
SET PAD KNEE POSITIONER (MISCELLANEOUS) ×3 IMPLANT
SPONGE LAP 18X18 X RAY DECT (DISPOSABLE) IMPLANT
STRIP CLOSURE SKIN 1/2X4 (GAUZE/BANDAGES/DRESSINGS) ×4 IMPLANT
SUT BONE WAX W31G (SUTURE) IMPLANT
SUT MNCRL AB 4-0 PS2 18 (SUTURE) ×3 IMPLANT
SUT VIC AB 1 CT1 27 (SUTURE) ×4
SUT VIC AB 1 CT1 27XBRD ANTBC (SUTURE) ×2 IMPLANT
SUT VIC AB 2-0 CT1 27 (SUTURE) ×6
SUT VIC AB 2-0 CT1 TAPERPNT 27 (SUTURE) ×3 IMPLANT
SUT VLOC 180 0 24IN GS25 (SUTURE) ×3 IMPLANT
SYR 20CC LL (SYRINGE) ×6 IMPLANT
TAPE STRIPS DRAPE STRL (GAUZE/BANDAGES/DRESSINGS) ×3 IMPLANT
TOWER CARTRIDGE SMART MIX (DISPOSABLE) ×3 IMPLANT
TRAY FOLEY W/METER SILVER 16FR (SET/KITS/TRAYS/PACK) ×3 IMPLANT
WATER STERILE IRR 1500ML POUR (IV SOLUTION) ×3 IMPLANT
WRAP KNEE MAXI GEL POST OP (GAUZE/BANDAGES/DRESSINGS) ×3 IMPLANT
YANKAUER SUCT BULB TIP 10FT TU (MISCELLANEOUS) ×3 IMPLANT

## 2015-11-28 NOTE — Progress Notes (Signed)
Floor notified pt will be in room, 1615, in 20 minutes.  Floor informed RN informed RN, room is being changed to 1609 and that room is not clean, and same RN is getting a pt now also.

## 2015-11-28 NOTE — Progress Notes (Signed)
PHARMACIST - PHYSICIAN ORDER COMMUNICATION  CONCERNING: P&T Medication Policy on Herbal Medications  DESCRIPTION:  This patient's order for:  Garlic has been noted.  This product(s) is classified as an "herbal" or natural product. Due to a lack of definitive safety studies or FDA approval, nonstandard manufacturing practices, plus the potential risk of unknown drug-drug interactions while on inpatient medications, the Pharmacy and Therapeutics Committee does not permit the use of "herbal" or natural products of this type within [facility].   ACTION TAKEN: The pharmacy department is unable to verify this order at this time and your patient has been informed of this safety policy. Please reevaluate patient's clinical condition at discharge and address if the herbal or natural product(s) should be resumed at that time.  Romeo Rabon, PharmD, pager [phone]. 11/28/2015,12:01 PM.

## 2015-11-28 NOTE — Interval H&P Note (Signed)
History and Physical Interval Note:  11/28/2015 8:03 AM  Carl Sutton  has presented today for surgery, with the diagnosis of right knee osteoarthritis  The various methods of treatment have been discussed with the patient and family. After consideration of risks, benefits and other options for treatment, the patient has consented to  Procedure(s): RIGHT TOTAL KNEE ARTHROPLASTY (Right) as a surgical intervention .  The patient's history has been reviewed, patient examined, no change in status, stable for surgery.  I have reviewed the patient's chart and labs.  Questions were answered to the patient's satisfaction.     Ramil Edgington A

## 2015-11-28 NOTE — Anesthesia Procedure Notes (Signed)
Procedure Name: Intubation Date/Time: 11/28/2015 8:32 AM Performed by: Maxwell Caul Pre-anesthesia Checklist: Patient identified, Emergency Drugs available, Suction available and Patient being monitored Patient Re-evaluated:Patient Re-evaluated prior to inductionOxygen Delivery Method: Circle system utilized Preoxygenation: Pre-oxygenation with 100% oxygen Intubation Type: IV induction Ventilation: Mask ventilation without difficulty Laryngoscope Size: Mac and 4 Grade View: Grade I Tube type: Oral Tube size: 7.5 mm Number of attempts: 1 Airway Equipment and Method: Stylet Placement Confirmation: ETT inserted through vocal cords under direct vision,  positive ETCO2 and breath sounds checked- equal and bilateral Secured at: 21 cm Tube secured with: Tape Dental Injury: Teeth and Oropharynx as per pre-operative assessment

## 2015-11-28 NOTE — Progress Notes (Signed)
CSW consulted for SNF placement. PN reviewed. PT recommends HHPT at d/c. RNCM will assist with d/c planning.  Werner Lean LCSW [phone]

## 2015-11-28 NOTE — Brief Op Note (Signed)
11/28/2015  10:02 AM  PATIENT:  Corine Shelter  75 y.o. male  PRE-OPERATIVE DIAGNOSIS:  right kneePrimary  osteoarthritis  POST-OPERATIVE DIAGNOSIS:  right knee Primaryosteoarthritis  PROCEDURE:  Procedure(s): RIGHT TOTAL KNEE ARTHROPLASTY (Right)  SURGEON:  Surgeon(s) and Role:    * Latanya Maudlin, MD - Primary  PHYSICIAN ASSISTANT: Ardeen Jourdain PA  ASSISTANTS: Ardeen Jourdain PA  ANESTHESIA:   general  EBL:  Total I/O In: -  Out: 400 [Urine:400]  BLOOD ADMINISTERED:none  DRAINS: One Hemovac Drain inserted into Right Knee   LOCAL MEDICATIONS USED:  MARCAINE 20cc of 0.50% Plain. And Exparel 20cc mixed with 20cc of Normal Saline.    SPECIMEN:  No Specimen  DISPOSITION OF SPECIMEN:  N/A  COUNTS:  YES  TOURNIQUET:  * Missing tourniquet times found for documented tourniquets in log:  [id number] *  DICTATION: .Other Dictation: Dictation Number [id number]  PLAN OF CARE: Other Dictation: Dictation Number [id number]  PATIENT DISPOSITIONStable in ORStable in OR   Delay start of Pharmacological VTE agent (>24hrs) due to surgical blood loss or risk of bleeding: yes

## 2015-11-28 NOTE — Anesthesia Preprocedure Evaluation (Signed)
Anesthesia Evaluation  Patient identified by MRN, date of birth, ID band Patient awake    Reviewed: Allergy & Precautions, NPO status , Patient's Chart, lab work & pertinent test results  Airway Mallampati: II  TM Distance: >3 FB Neck ROM: Full    Dental no notable dental hx. (+) Edentulous Upper, Edentulous Lower   Pulmonary Current Smoker,    Pulmonary exam normal breath sounds clear to auscultation       Cardiovascular hypertension, + CAD, + Cardiac Stents (2009) and + CABG (1993)  Normal cardiovascular exam Rhythm:Regular Rate:Normal  Stress test 9/17: no ischemia   Neuro/Psych negative neurological ROS  negative psych ROS   GI/Hepatic negative GI ROS, Neg liver ROS,   Endo/Other  diabetes, Type 2  Renal/GU negative Renal ROS  negative genitourinary   Musculoskeletal negative musculoskeletal ROS (+)   Abdominal   Peds negative pediatric ROS (+)  Hematology negative hematology ROS (+)   Anesthesia Other Findings   Reproductive/Obstetrics negative OB ROS                            Anesthesia Physical Anesthesia Plan  ASA: II  Anesthesia Plan: General   Post-op Pain Management:    Induction: Intravenous  Airway Management Planned: Oral ETT  Additional Equipment:   Intra-op Plan:   Post-operative Plan: Extubation in OR  Informed Consent: I have reviewed the patients History and Physical, chart, labs and discussed the procedure including the risks, benefits and alternatives for the proposed anesthesia with the patient or authorized representative who has indicated his/her understanding and acceptance.   Dental advisory given  Plan Discussed with: CRNA  Anesthesia Plan Comments:         Anesthesia Quick Evaluation

## 2015-11-28 NOTE — Evaluation (Signed)
Physical Therapy Evaluation Patient Details Name: Carl Sutton MRN: [id number] DOB: 11-Jun-1940 Today's Date: 11/28/2015   History of Present Illness  75 yo male s/p R TKA 11/28/15  Clinical Impression  On eval POD 0, pt required Min assist for mobility. He walked ~50 feet with a Rw. Will follow and progress activity as tolerated.     Follow Up Recommendations Home health PT;Supervision - Intermittent    Equipment Recommendations  None recommended by PT    Recommendations for Other Services       Precautions / Restrictions Precautions Precautions: Fall Required Braces or Orthoses: Knee Immobilizer - Right Knee Immobilizer - Right: Discontinue once straight leg raise with < 10 degree lag Restrictions Weight Bearing Restrictions: No RLE Weight Bearing: Weight bearing as tolerated      Mobility  Bed Mobility Overal bed mobility: Needs Assistance Bed Mobility: Supine to Sit     Supine to sit: Min assist;HOB elevated     General bed mobility comments: Assist for R LE.   Transfers Overall transfer level: Needs assistance Equipment used: Rolling walker (2 wheeled) Transfers: Sit to/from Stand Sit to Stand: Min assist;From elevated surface         General transfer comment: VCs safety, technique, hand/LE placement. Assist to rise, stabilize, control descent.   Ambulation/Gait Ambulation/Gait assistance: Min assist Ambulation Distance (Feet): 50 Feet Assistive device: Rolling walker (2 wheeled) Gait Pattern/deviations: Step-to pattern     General Gait Details: VCs safety, technique, sequence. Assist to stabilize.   Stairs            Wheelchair Mobility    Modified Rankin (Stroke Patients Only)       Balance                                             Pertinent Vitals/Pain Pain Assessment: Faces Faces Pain Scale: Hurts little more Pain Location: R knee with activity Pain Descriptors / Indicators: Aching;Sore Pain  Intervention(s): Monitored during session;Ice applied;Repositioned    Home Living Family/patient expects to be discharged to:: Private residence Living Arrangements: Alone Available Help at Discharge: Friend(s) Type of Home: House Home Access: 1 step     Home Layout: One level Home Equipment: Environmental consultant - 2 wheels;Cane - single point;Bedside commode      Prior Function Level of Independence: Independent               Hand Dominance        Extremity/Trunk Assessment   Upper Extremity Assessment: Defer to OT evaluation           Lower Extremity Assessment: Generalized weakness      Cervical / Trunk Assessment: Normal  Communication   Communication: No difficulties  Cognition Arousal/Alertness: Awake/alert Behavior During Therapy: WFL for tasks assessed/performed Overall Cognitive Status: Within Functional Limits for tasks assessed                      General Comments      Exercises     Assessment/Plan    PT Assessment Patient needs continued PT services  PT Problem List Decreased strength;Decreased mobility;Decreased range of motion;Decreased activity tolerance;Decreased balance;Pain;Decreased knowledge of use of DME;Decreased knowledge of precautions          PT Treatment Interventions DME instruction;Gait training;Therapeutic activities;Therapeutic exercise;Stair training;Balance training;Functional mobility training;Patient/family education    PT Goals (Current goals can  be found in the Care Plan section)  Acute Rehab PT Goals Patient Stated Goal: home PT Goal Formulation: With patient Time For Goal Achievement: 12/12/15 Potential to Achieve Goals: Good    Frequency 7X/week   Barriers to discharge        Co-evaluation               End of Session Equipment Utilized During Treatment: Gait belt;Right knee immobilizer Activity Tolerance: Patient tolerated treatment well Patient left: in chair;with call bell/phone within  reach;with chair alarm set           Time: [id number] PT Time Calculation (min) (ACUTE ONLY): 27 min   Charges:   PT Evaluation $PT Eval Low Complexity: 1 Procedure PT Treatments $Gait Training: 8-22 mins   PT G Codes:        Weston Anna, MPT Pager: [phone]

## 2015-11-28 NOTE — Anesthesia Postprocedure Evaluation (Signed)
Anesthesia Post Note  Patient: Carl Sutton  Procedure(s) Performed: Procedure(s) (LRB): RIGHT TOTAL KNEE ARTHROPLASTY (Right)  Patient location during evaluation: PACU Anesthesia Type: General Level of consciousness: awake and alert Pain management: pain level controlled Vital Signs Assessment: post-procedure vital signs reviewed and stable Respiratory status: spontaneous breathing, nonlabored ventilation, respiratory function stable and patient connected to nasal cannula oxygen Cardiovascular status: blood pressure returned to baseline and stable Postop Assessment: no signs of nausea or vomiting Anesthetic complications: no    Last Vitals:  Vitals:   11/28/15 1400 11/28/15 1505  BP: (!) 133/59 137/72  Pulse: 84 86  Resp: 16 16  Temp: 36.7 C 36.6 C    Last Pain:  Vitals:   11/28/15 1505  TempSrc: Oral  PainSc:                  [name]

## 2015-11-28 NOTE — Transfer of Care (Signed)
Immediate Anesthesia Transfer of Care Note  Patient: Carl Sutton  Procedure(s) Performed: Procedure(s): RIGHT TOTAL KNEE ARTHROPLASTY (Right)  Patient Location: PACU  Anesthesia Type:General  Level of Consciousness:  sedated, patient cooperative and responds to stimulation  Airway & Oxygen Therapy:Patient Spontanous Breathing and Patient connected to face mask oxgen  Post-op Assessment:  Report given to PACU RN and Post -op Vital signs reviewed and stable  Post vital signs:  Reviewed and stable  Last Vitals:  Vitals:   11/28/15 0626  BP: (!) 157/75  Pulse: 73  Resp: 18  Temp: 123XX123 C    Complications: No apparent anesthesia complications

## 2015-11-29 LAB — CBC
HCT: 32.2 % — ABNORMAL LOW (ref 39.0–52.0)
HEMOGLOBIN: 10.9 g/dL — AB (ref 13.0–17.0)
MCH: 31 pg (ref 26.0–34.0)
MCHC: 33.9 g/dL (ref 30.0–36.0)
MCV: 91.5 fL (ref 78.0–100.0)
Platelets: 162 10*3/uL (ref 150–400)
RBC: 3.52 MIL/uL — AB (ref 4.22–5.81)
RDW: 16.1 % — ABNORMAL HIGH (ref 11.5–15.5)
WBC: 14.5 10*3/uL — AB (ref 4.0–10.5)

## 2015-11-29 LAB — BASIC METABOLIC PANEL
Anion gap: 8 (ref 5–15)
BUN: 9 mg/dL (ref 6–20)
CHLORIDE: 102 mmol/L (ref 101–111)
CO2: 25 mmol/L (ref 22–32)
CREATININE: 0.7 mg/dL (ref 0.61–1.24)
Calcium: 8.9 mg/dL (ref 8.9–10.3)
GFR calc non Af Amer: >60 mL/min (ref >60)
Glucose, Bld: 223 mg/dL — ABNORMAL HIGH (ref 65–99)
POTASSIUM: 3.9 mmol/L (ref 3.5–5.1)
SODIUM: 135 mmol/L (ref 135–145)

## 2015-11-29 LAB — GLUCOSE, CAPILLARY
GLUCOSE-CAPILLARY: 218 mg/dL — AB (ref 65–99)
GLUCOSE-CAPILLARY: 205 mg/dL — AB (ref 65–99)
Glucose-Capillary: 190 mg/dL — ABNORMAL HIGH (ref 65–99)

## 2015-11-29 NOTE — Progress Notes (Addendum)
Physical Therapy Treatment Patient Details Name: Carl Sutton MRN: [id number] DOB: 05-26-1940 Today's Date: 11/29/2015    History of Present Illness 75 yo male s/p R TKA 11/28/15    PT Comments    Progressing with mobility. Pt is unsafe at times. LOB x1 during this session despite cueing for safe technique.   Follow Up Recommendations  Home health PT;Supervision/Assistance - 24 hour     Equipment Recommendations  None recommended by PT    Recommendations for Other Services       Precautions / Restrictions Precautions Precautions: Fall;Knee Required Braces or Orthoses: Knee Immobilizer - Right Knee Immobilizer - Right: Discontinue once straight leg raise with < 10 degree lag Restrictions Weight Bearing Restrictions: No RLE Weight Bearing: Weight bearing as tolerated    Mobility  Bed Mobility Overal bed mobility: Needs Assistance Bed Mobility: Supine to Sit;Sit to Supine     Supine to sit: Min assist Sit to supine: Min assist   General bed mobility comments: Assist for R LE  Transfers Overall transfer level: Needs assistance Equipment used: Rolling walker (2 wheeled) Transfers: Sit to/from Stand Sit to Stand: Min assist         General transfer comment: VCs safety, technique, hand/LE placement. Assist to rise, stabilize, control descent. LOB while pt was lowering to sit at EOB. He all of a sudden lost his footing and hand grip and fell back onto bed  Ambulation/Gait Ambulation/Gait assistance: Min assist Ambulation Distance (Feet): 115 Feet Assistive device: Rolling walker (2 wheeled) Gait Pattern/deviations: Step-to pattern     General Gait Details: VCs safety, technique, sequence. Assist to stabilize intermittently.    Stairs            Wheelchair Mobility    Modified Rankin (Stroke Patients Only)       Balance                                    Cognition Arousal/Alertness: Awake/alert Behavior During Therapy: WFL  for tasks assessed/performed Overall Cognitive Status: Within Functional Limits for tasks assessed                      Exercises    General Comments        Pertinent Vitals/Pain Pain Assessment: 0-10 Pain Score: 5  Pain Location: R knee Pain Descriptors / Indicators: Aching;Sore Pain Intervention(s): Monitored during session;Repositioned;Ice applied    Home Living                      Prior Function            PT Goals (current goals can now be found in the care plan section) Progress towards PT goals: Progressing toward goals    Frequency    7X/week      PT Plan Current plan remains appropriate    Co-evaluation             End of Session Equipment Utilized During Treatment: Gait belt;Right knee immobilizer Activity Tolerance: Patient tolerated treatment well Patient left: in bed;with call bell/phone within reach;with bed alarm set     Time: 1350-1404 PT Time Calculation (min) (ACUTE ONLY): 14 min  Charges:  $Gait Training: 8-22 mins                    G Codes:      Weston Anna, MPT Pager: [phone]

## 2015-11-29 NOTE — Op Note (Signed)
NAMEBENJIMAN, HODGEMAN NO.:  [id number]  MEDICAL RECORD NO.:  [id number]  LOCATION:  [id number]                         FACILITY:  [facility]  PHYSICIAN:  Kipp Brood. Rasheedah Reis, M.D.DATE OF BIRTH:  05/31/1940  DATE OF PROCEDURE: DATE OF DISCHARGE:                              OPERATIVE REPORT   SURGEON:  Pearson Picou A. Gladstone Lighter, M.D.  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  OPERATION:  Right total knee arthroplasty utilizing DePuy system.  All three components were cemented.  Gentamicin was used in the cement.  The sizes used was a size 2.5 right posterior cruciate sacrificing femoral component.  The tibial tray was a size 2.5, the insert size 2.5, 10-mm thickness insert.  The patella was a size 35 with 3 pegs.  DESCRIPTION OF PROCEDURE:  Under general anesthesia, routine orthopedic prep and draping of the right lower extremity was carried out, the tourniquet was elevated at 325 mm of pressure after we exsanguinated the right leg.  We also marked the appropriate right leg in the holding area and as I said, the time-out was carried out.  The knee then was flexed, placed in the [facility] knee holder and anterior approach of the knee was carried out.  At this time, the patient also had 2 g of IV Ancef and 1 g of tranexamic acid.  Following that, an incision was made over the anterior aspect of the right knee.  Bleeders were identified and cauterized.  Two flaps were created.  I then went down and carried out a median parapatellar incision.  Following that, the patella was reflected laterally and with the knee flexed, I did medial and lateral meniscectomies, I excised the anterior, posterior AND cruciate ligaments.  Great care was taken to preserve  the collateral ligaments. I then made my initial drill hole in the intercondylar notch of the femur.  A guide rod was inserted up to canal very nicely.  I then removed that, thoroughly water picked out the knee, irrigated out the femur and then after the first jig, we removed 11-mm thickness of the distal femur.  We then measured distal femur to be a size 2.5.  We did anterior, posterior and chamfering cuts for size 2.5 femoral component. At that time, we prepared the tibia in the usual fashion.  We initially removed the spinous processes of the oscillating saw, then made initial drill hole in the tibial plateau.  Guide rod was inserted.  We then removed that, irrigated out the canal and then inserted our next jig, and removed 4 mm thickness off the tibia utilizing the medial side as our guide.  After this was carried out, we then inserted our lamina spreaders, removed the posterior spurs from the femoral condyle, irrigated out the knee and then went through trial with the spacer blocks.  We had a nice fit with a 10-mm thickness spacer block.  At that particular time, we then went on and then completed our keel cut out of the tibial plateau.  We  then did our notch cut out of the distal femur. We inserted our trial components and then we flexed the knee and extended the knee, checked for lateral medial instability.  We had a good stable construct.  We then did a resurfacing procedure on the patella.  We removed the appropriate amount of bone from the patella. The patella then was measured to be 35 mm.  We then made our three drill holes in the articular surface of the patella.  We inserted our trial components, and fit quite nicely.  At this time, all trial components were removed, we water picked out the knee, dried the knee out, cemented all three components in simultaneously.  Once the cement was hardened, all loose pieces of cement were removed.  Once again, we had a nice fit with our  polyethylene, rotating platform insert at the end of the procedure.  I inserted some Ultrafoam Gelfoam into the posterior aspect of the knee for hemostasis purposes and Hemovac drain was inserted. Knee was closed in layers in usual fashion.          ______________________________ Kipp Brood Gladstone Lighter, M.D.     RAG/MEDQ  D:  11/28/2015  T:  11/29/2015  Job:  [id number]

## 2015-11-29 NOTE — Progress Notes (Signed)
Subjective: 1 Day Post-Op Procedure(s) (LRB): RIGHT TOTAL KNEE ARTHROPLASTY (Right) Patient reports pain as 3 on 0-10 scale.Doing well. Dressing changed. Hemovac came out last night during PT    Objective: Vital signs in last 24 hours: Temp:  [97.7 F (36.5 C)-98.9 F (37.2 C)] 98.5 F (36.9 C) (10/05 0559) Pulse Rate:  [74-92] 88 (10/05 0559) Resp:  [11-27] 15 (10/05 0559) BP: (109-181)/(49-88) 124/56 (10/05 0559) SpO2:  [92 %-100 %] 96 % (10/05 0559) Weight:  [93 kg (205 lb)] 93 kg (205 lb) (10/04 1150)  Intake/Output from previous day: 10/04 0701 - 10/05 0700 In: 2138.3 [P.O.:480; I.V.:1493.3; IV Piggyback:165] Out: 2890 [Urine:2775; Drains:90; Blood:25] Intake/Output this shift: No intake/output data recorded.   Recent Labs  11/29/15 0421  HGB 10.9*    Recent Labs  11/29/15 0421  WBC 14.5*  RBC 3.52*  HCT 32.2*  PLT 162    Recent Labs  11/29/15 0421  NA 135  K 3.9  CL 102  CO2 25  BUN 9  CREATININE 0.70  GLUCOSE 223*  CALCIUM 8.9   No results for input(s): LABPT, INR in the last 72 hours.  Dorsiflexion/Plantar flexion intact No cellulitis present  Assessment/Plan: 1 Day Post-Op Procedure(s) (LRB): RIGHT TOTAL KNEE ARTHROPLASTY (Right) Up with therapy discontinued planning for tomorrow.  Sotiria Keast A 11/29/2015, 7:23 AM

## 2015-11-29 NOTE — Progress Notes (Signed)
Physical Therapy Treatment Patient Details Name: Carl Sutton MRN: [id number] DOB: 06/13/1940 Today's Date: 11/29/2015    History of Present Illness 75 yo male s/p R TKA 11/28/15    PT Comments    Progressing with mobility.   Follow Up Recommendations  Home health PT;Supervision/Assistance - 24 hour     Equipment Recommendations  None recommended by PT    Recommendations for Other Services       Precautions / Restrictions Precautions Precautions: Fall;Knee Required Braces or Orthoses: Knee Immobilizer - Right Knee Immobilizer - Right: Discontinue once straight leg raise with < 10 degree lag Restrictions Weight Bearing Restrictions: No RLE Weight Bearing: Weight bearing as tolerated    Mobility  Bed Mobility Overal bed mobility: Needs Assistance Bed Mobility: Supine to Sit     Supine to sit: Min assist;HOB elevated     General bed mobility comments: oob in recliner  Transfers Overall transfer level: Needs assistance Equipment used: Rolling walker (2 wheeled) Transfers: Sit to/from Stand Sit to Stand: Min assist         General transfer comment: VCs safety, technique, hand/LE placement. Assist to rise, stabilize, control descent.   Ambulation/Gait Ambulation/Gait assistance: Min assist Ambulation Distance (Feet): 70 Feet Assistive device: Rolling walker (2 wheeled) Gait Pattern/deviations: Step-to pattern;Antalgic     General Gait Details: VCs safety, technique, sequence. Assist to stabilize intermittently.    Stairs            Wheelchair Mobility    Modified Rankin (Stroke Patients Only)       Balance                                    Cognition Arousal/Alertness: Awake/alert Behavior During Therapy: WFL for tasks assessed/performed Overall Cognitive Status: Within Functional Limits for tasks assessed                      Exercises Total Joint Exercises Ankle Circles/Pumps: AROM;Both;10 reps;Supine Quad  Sets: AROM;Both;10 reps;Supine Heel Slides: AAROM;10 reps;Supine Hip ABduction/ADduction: AAROM;Right;10 reps;Supine Straight Leg Raises: AAROM;Right;10 reps;Supine Goniometric ROM: ~10-60 degrees    General Comments        Pertinent Vitals/Pain Pain Assessment: 0-10 Pain Score: 7  Pain Location: R knee Pain Descriptors / Indicators: Aching;Sore Pain Intervention(s): Monitored during session;Repositioned;Ice applied    Home Living Family/patient expects to be discharged to:: Private residence Living Arrangements: Alone Available Help at Discharge: Other (Comment) (reports he tried to have neighbor assist but neighbor can't) Type of Home: House Home Access: Level entry   Home Layout: One level Home Equipment: Walker - 2 wheels;Cane - single point;Bedside commode      Prior Function Level of Independence: Independent          PT Goals (current goals can now be found in the care plan section) Acute Rehab PT Goals Patient Stated Goal: home Progress towards PT goals: Progressing toward goals    Frequency    7X/week      PT Plan Current plan remains appropriate    Co-evaluation             End of Session Equipment Utilized During Treatment: Gait belt;Right knee immobilizer Activity Tolerance: Patient tolerated treatment well Patient left: in chair;with call bell/phone within reach;with chair alarm set;with family/visitor present     Time: [id number] PT Time Calculation (min) (ACUTE ONLY): 22 min  Charges:  $Gait Training: 8-22 mins  G Codes:      Weston Anna, MPT Pager: [phone]

## 2015-11-29 NOTE — Progress Notes (Signed)
Occupational Therapy Evaluation Patient Details Name: Carl Sutton MRN: [id number] DOB: Mar 11, 1940 Today's Date: 11/29/2015    History of Present Illness 75 yo male s/p R TKA 11/28/15   Clinical Impression   Patient presents to OT with decreased ADL and mobility skills s/p R TKA. He reports he lives alone and was trying to get a neighbor to help him out at discharge but has been unsuccessful. He will benefit from skilled OT to maximize function. Recommend 24/7 S/A at home. OT will follow.    Follow Up Recommendations  Home health OT;Supervision/Assistance - 24 hour    Equipment Recommendations  None recommended by OT    Recommendations for Other Services       Precautions / Restrictions Precautions Precautions: Fall;Knee Required Braces or Orthoses: Knee Immobilizer - Right Knee Immobilizer - Right: Discontinue once straight leg raise with < 10 degree lag Restrictions Weight Bearing Restrictions: No RLE Weight Bearing: Weight bearing as tolerated      Mobility Bed Mobility Overal bed mobility: Needs Assistance Bed Mobility: Supine to Sit     Supine to sit: Min assist;HOB elevated     General bed mobility comments: Assist for R LE.   Transfers Overall transfer level: Needs assistance Equipment used: Rolling walker (2 wheeled) Transfers: Sit to/from Stand Sit to Stand: Min assist;From elevated surface         General transfer comment: VCs safety, technique, hand/LE placement. Assist to rise, stabilize, control descent.     Balance                                            ADL Overall ADL's : Needs assistance/impaired Eating/Feeding: Independent;Sitting   Grooming: Wash/dry hands;Set up;Sitting           Upper Body Dressing : Set up;Sitting   Lower Body Dressing: Moderate assistance;Sit to/from stand   Toilet Transfer: Minimal assistance;Ambulation;BSC;RW   Toileting- Clothing Manipulation and Hygiene: Min guard;Minimal  assistance;Sit to/from stand       Functional mobility during ADLs: Minimal assistance;Rolling walker General ADL Comments: Patient mod-min A with ADLs and functional mobility during evaluation. He has no assistance at home. Discussed doing sponge bathing until he can get legs over tub; pt in agreement. OT will follow.     Vision     Perception     Praxis      Pertinent Vitals/Pain Pain Assessment: 0-10 Pain Score: 5  Pain Location: R knee with mobility Pain Descriptors / Indicators: Aching;Sore Pain Intervention(s): Limited activity within patient's tolerance;Monitored during session;Ice applied;Patient requesting pain meds-RN notified     Hand Dominance     Extremity/Trunk Assessment Upper Extremity Assessment Upper Extremity Assessment: Overall WFL for tasks assessed   Lower Extremity Assessment Lower Extremity Assessment: Defer to PT evaluation       Communication Communication Communication: No difficulties   Cognition Arousal/Alertness: Awake/alert Behavior During Therapy: WFL for tasks assessed/performed Overall Cognitive Status: Within Functional Limits for tasks assessed                     General Comments       Exercises       Shoulder Instructions      Home Living Family/patient expects to be discharged to:: Private residence Living Arrangements: Alone Available Help at Discharge: Other (Comment) (reports he tried to have neighbor assist but neighbor can't) Type  of Home: House Home Access: Level entry     Home Layout: One level     Bathroom Shower/Tub: Teacher, Carl Sutton years/pre: Standard     Home Equipment: Environmental consultant - 2 wheels;Cane - single point;Bedside commode          Prior Functioning/Environment Level of Independence: Independent                 OT Problem List: Decreased strength;Decreased range of motion;Decreased activity tolerance;Impaired balance (sitting and/or standing);Decreased knowledge of use of  DME or AE;Pain   OT Treatment/Interventions: Self-care/ADL training;DME and/or AE instruction;Therapeutic activities;Patient/family education    OT Goals(Current goals can be found in the care plan section) Acute Rehab OT Goals Patient Stated Goal: home OT Goal Formulation: With patient Time For Goal Achievement: 12/13/15 Potential to Achieve Goals: Good ADL Goals Pt Will Perform Lower Body Bathing: with supervision;sit to/from stand;with adaptive equipment Pt Will Perform Lower Body Dressing: with supervision;with adaptive equipment;sit to/from stand Pt Will Transfer to Toilet: with supervision;bedside commode;ambulating Pt Will Perform Toileting - Clothing Manipulation and hygiene: with supervision;sit to/from stand  OT Frequency: Min 2X/week   Barriers to D/C: Decreased caregiver support          Co-evaluation              End of Session Equipment Utilized During Treatment: Right knee immobilizer;Rolling walker Nurse Communication: Mobility status;Patient requests pain meds  Activity Tolerance: Patient tolerated treatment well Patient left: in chair;with call bell/phone within reach;with chair alarm set   Time: [id number] OT Time Calculation (min): 32 min Charges:  OT General Charges $OT Visit: 1 Procedure OT Evaluation $OT Eval Low Complexity: 1 Procedure OT Treatments $Self Care/Home Management : 8-22 mins G-Codes:    Carl Sutton A 2015/12/18, 9:40 AM

## 2015-11-29 NOTE — Progress Notes (Addendum)
Inpatient Diabetes Program Recommendations  AACE/ADA: New Consensus Statement on Inpatient Glycemic Control (2015)  Target Ranges:  Prepandial:   less than 140 mg/dL      Peak postprandial:   less than 180 mg/dL (1-2 hours)      Critically ill patients:  140 - 180 mg/dL   Results for KAIVON, CONDITT (MRN [id number]) as of 11/29/2015 11:47  Ref. Range 11/28/2015 06:37 11/28/2015 10:33 11/28/2015 15:11 11/28/2015 21:40 11/29/2015 07:29  Glucose-Capillary Latest Ref Range: 65 - 99 mg/dL 200 (H) 199 (H) 185 (H) 162 (H) 218 (H)    Results for ANGELLUIS, KEATH (MRN [id number]) as of 11/29/2015 11:47  Ref. Range 11/22/2015 15:20  Hemoglobin A1C Latest Ref Range: 4.8 - 5.6 % 6.5 (H)   Review of Glycemic Control  Diabetes history: DM2 Outpatient Diabetes medications: Humalog 0-15 units - not taken for over 1 month; Detemir 35 units BID - last taken 17 units on 10/3; Metformin 1000 mg BID Current orders for Inpatient glycemic control: Novolog 0-15 units TIDWC  Inpatient Diabetes Program Recommendations:  Please consider:      Novolog 0-5 units QHS;      Detemir 10 units daily. Note:  Talked with patient 10/5 regarding home management of diabetes.  Patient states he take CBG's 2-3 times per day and has not needed insulin to maintain CBG's within normal range.  Patient states he had a PCP in [place], [place], but has not found a PCP in [place] since he moved here a few years ago.  He is currently looking for a PCP to follow up with to manage his DM.  Thank you,  Windy Carina, RN, BSN Diabetes Coordinator Inpatient Diabetes Program [phone] (Team Pager) [phone] ([place] office) [phone] [facility] office) [phone] [facility] office)

## 2015-11-29 NOTE — Care Management Note (Signed)
Case Management Note  Patient Details  Name: Carl Sutton MRN: [id number] Date of Birth: 1940-03-14  Subjective/Objective:                  RIGHT TOTAL KNEE ARTHROPLASTY (Right) Action/Plan: Discharge planning Expected Discharge Date:  11/30/15               Expected Discharge Plan:  [place]  In-House Referral:     Discharge planning Services  CM Consult  Post Acute Care Choice:  Home Health Choice offered to:  Patient  DME Arranged:  N/A DME Agency:  NA  HH Arranged:  PT [place] Agency:  [facility]  Status of Service:  Completed, signed off  If discussed at [employer], dates discussed:    Additional Comments: CM met with pt in room to offer choice of home health agency.  Pt chooses [facility] to render HHPT.  Referral called to [facility] rep, Stanton Kidney.  Pt has all DME needed at home.  No other CM needs were communicated. Dellie Catholic, RN 11/29/2015, 1:07 PM

## 2015-11-30 LAB — BASIC METABOLIC PANEL
Anion gap: 7 (ref 5–15)
BUN: 8 mg/dL (ref 6–20)
CHLORIDE: 104 mmol/L (ref 101–111)
CO2: 25 mmol/L (ref 22–32)
CREATININE: 0.62 mg/dL (ref 0.61–1.24)
Calcium: 9.2 mg/dL (ref 8.9–10.3)
GFR calc Af Amer: >60 mL/min (ref >60)
GFR calc non Af Amer: >60 mL/min (ref >60)
GLUCOSE: 197 mg/dL — AB (ref 65–99)
Potassium: 4.1 mmol/L (ref 3.5–5.1)
Sodium: 136 mmol/L (ref 135–145)

## 2015-11-30 LAB — GLUCOSE, CAPILLARY
GLUCOSE-CAPILLARY: 206 mg/dL — AB (ref 65–99)
GLUCOSE-CAPILLARY: 181 mg/dL — AB (ref 65–99)

## 2015-11-30 LAB — CBC
HEMATOCRIT: 30.2 % — AB (ref 39.0–52.0)
HEMOGLOBIN: 10.3 g/dL — AB (ref 13.0–17.0)
MCH: 31.1 pg (ref 26.0–34.0)
MCHC: 34.1 g/dL (ref 30.0–36.0)
MCV: 91.2 fL (ref 78.0–100.0)
Platelets: 151 10*3/uL (ref 150–400)
RBC: 3.31 MIL/uL — ABNORMAL LOW (ref 4.22–5.81)
RDW: 15.9 % — ABNORMAL HIGH (ref 11.5–15.5)
WBC: 20.2 10*3/uL — ABNORMAL HIGH (ref 4.0–10.5)

## 2015-11-30 MED ORDER — DOCUSATE SODIUM 100 MG PO CAPS: 100.0000 mg | ORAL_CAPSULE | Freq: Two times a day (BID) | ORAL | 0 refills | Status: DC

## 2015-11-30 MED ORDER — OXYCODONE-ACETAMINOPHEN 5-325 MG PO TABS: 1.0000 | ORAL_TABLET | ORAL | 0 refills | Status: DC | PRN

## 2015-11-30 MED ORDER — ASPIRIN 325 MG PO TABS: 325.0000 mg | ORAL_TABLET | Freq: Two times a day (BID) | ORAL | 0 refills | Status: DC

## 2015-11-30 MED ORDER — DOCUSATE SODIUM 100 MG PO CAPS
100.0000 mg | ORAL_CAPSULE | Freq: Two times a day (BID) | ORAL | Status: DC
  Administered 2015-11-30: 100 mg via ORAL
  Filled 2015-11-30: qty 1

## 2015-11-30 MED ORDER — METHOCARBAMOL 500 MG PO TABS: 500.0000 mg | ORAL_TABLET | Freq: Four times a day (QID) | ORAL | 1 refills | Status: DC | PRN

## 2015-11-30 MED ORDER — POLYETHYLENE GLYCOL 3350 17 G PO PACK: 17.0000 g | PACK | Freq: Every day | ORAL | 0 refills | Status: DC | PRN

## 2015-11-30 NOTE — Progress Notes (Signed)
   Subjective: 2 Days Post-Op Procedure(s) (LRB): RIGHT TOTAL KNEE ARTHROPLASTY (Right) Patient reports pain as moderate.   Patient seen in rounds with Dr. Gladstone Lighter. Patient is well, but has had some minor complaints of pain in the right knee, requiring pain medications . He denies SOB and chest pain. No issues overnight. Voiding well. Patient denies BM and flatus. Denies abdominal pain.   Objective: Vital signs in last 24 hours: Temp:  [98 F (36.7 C)-99.7 F (37.6 C)] 99.5 F (37.5 C) (10/06 0703) Pulse Rate:  [79-92] 83 (10/06 0703) Resp:  [16] 16 (10/06 0703) BP: (119-144)/(54-61) 120/61 (10/06 0703) SpO2:  [93 %-97 %] 94 % (10/06 0703)  Intake/Output from previous day:  Intake/Output Summary (Last 24 hours) at 11/30/15 0733 Last data filed at 11/30/15 0704  Gross per 24 hour  Intake              960 ml  Output             2350 ml  Net            -1390 ml    Intake/Output this shift: Total I/O In: 120 [P.O.:120] Out: -   Labs:  Recent Labs  11/29/15 0421 11/30/15 0508  HGB 10.9* 10.3*    Recent Labs  11/29/15 0421 11/30/15 0508  WBC 14.5* 20.2*  RBC 3.52* 3.31*  HCT 32.2* 30.2*  PLT 162 151    Recent Labs  11/29/15 0421 11/30/15 0508  NA 135 136  K 3.9 4.1  CL 102 104  CO2 25 25  BUN 9 8  CREATININE 0.70 0.62  GLUCOSE 223* 197*  CALCIUM 8.9 9.2    EXAM General - Patient is Alert and Oriented Extremity - Neurologically intact Intact pulses distally Dorsiflexion/Plantar flexion intact No cellulitis present Compartment soft Dressing/Incision - clean, dry, no drainage Motor Function - intact, moving foot and toes well on exam.  No abdominal distention  Past Medical History:  Diagnosis Date  . Arthritis    oa  . Cancer ([place])    skin cancer area removed basal cell  . Coronary artery disease   . Diabetes mellitus without complication ([place])   . Gallstones   . GERD (gastroesophageal reflux disease)   . History of kidney stones yrs ago    . Hyperlipidemia   . Hypertension     Assessment/Plan: 2 Days Post-Op Procedure(s) (LRB): RIGHT TOTAL KNEE ARTHROPLASTY (Right) Active Problems:   S/P TKR (total knee replacement) using cement, right  Estimated body mass index is 31.63 kg/m as calculated from the following:   Height as of this encounter: 5' 7.5" (1.715 m).   Weight as of this encounter: 93 kg (205 lb). Advance diet Up with therapy Discharge home with home health  DVT Prophylaxis - Xarelto Weight-Bearing as tolerated   Will continue to monitor patient to ensure that his bowel are moving. RN reports patient has had flatus and has been attempting BM on bedside commode. Will plan for DC home this afternoon as long as bowels cooperate. Will continue bowel protocol. Discharge instructions given. Follow up in office in 2 weeks.   Ardeen Jourdain, PA-C Orthopaedic Surgery 11/30/2015, 7:33 AM

## 2015-11-30 NOTE — Progress Notes (Signed)
Inpatient Diabetes Program Recommendations  AACE/ADA: New Consensus Statement on Inpatient Glycemic Control (2015)  Target Ranges:  Prepandial:   less than 140 mg/dL      Peak postprandial:   less than 180 mg/dL (1-2 hours)      Critically ill patients:  140 - 180 mg/dL   Lab Results  Component Value Date   GLUCAP 181 (H) 11/30/2015   HGBA1C 6.5 (H) 11/22/2015    Review of Glycemic Control over last evening and agree with current plan.  Please continue to encourage patient to follow up with PCP for long-term management of DM.   Thank you,  Windy Carina, RN, BSN Diabetes Coordinator Inpatient Diabetes Program [phone] (Team Pager) [phone] ([place] office) [phone] [facility] office) [phone] [facility] office)

## 2015-11-30 NOTE — Discharge Instructions (Signed)

## 2015-11-30 NOTE — Progress Notes (Signed)
Occupational Therapy Treatment Patient Details Name: Carl Sutton MRN: [id number] DOB: 11-09-1940 Today's Date: 11/30/2015    History of present illness 75 yo male s/p R TKA 11/28/15   OT comments  Progressing towards OT goals. Continue OT per plan of care.  Follow Up Recommendations  Home health OT;Supervision/Assistance - 24 hour    Equipment Recommendations  None recommended by OT    Recommendations for Other Services      Precautions / Restrictions Precautions Precautions: Fall;Knee Required Braces or Orthoses: Knee Immobilizer - Right Knee Immobilizer - Right: Discontinue once straight leg raise with < 10 degree lag Restrictions Weight Bearing Restrictions: No RLE Weight Bearing: Weight bearing as tolerated       Mobility Bed Mobility            General bed mobility comments: NT -- OOB in recliner  Transfers Overall transfer level: Needs assistance Equipment used: Rolling walker (2 wheeled) Transfers: Sit to/from Stand Sit to Stand: Min guard;Supervision;From elevated surface             Balance                                   ADL Overall ADL's : Needs assistance/impaired Eating/Feeding: Independent;Sitting   Grooming: Wash/dry hands;Set up;Sitting                   Toilet Transfer: Min guard;Supervision/safety;Ambulation;BSC;RW   Toileting- Clothing Manipulation and Hygiene: Supervision/safety;Sit to/from Nurse, children's Details (indicate cue type and reason): N/A -- pt to sponge bathe initially Functional mobility during ADLs: Min guard;Supervision/safety;Rolling walker General ADL Comments: Patient doing better today. Already dressed in personal clothing; pt reports nurse tech assisted him. He reports his neighbor will be able to assist him with ADLs at home including getting dressed. Patient practiced toileting task during session. His plan is to d/c home today.       Vision                      Perception     Praxis      Cognition   Behavior During Therapy: WFL for tasks assessed/performed Overall Cognitive Status: Within Functional Limits for tasks assessed                       Extremity/Trunk Assessment               Exercises    Shoulder Instructions       General Comments      Pertinent Vitals/ Pain       Pain Assessment: 0-10 Pain Score: 5  Pain Location: R knee, back hips Pain Descriptors / Indicators: Aching;Sore Pain Intervention(s): Monitored during session;Repositioned;Ice applied  Home Living                                          Prior Functioning/Environment              Frequency  Min 2X/week        Progress Toward Goals  OT Goals(current goals can now be found in the care plan section)  Progress towards OT goals: Progressing toward goals  Acute Rehab OT Goals Patient Stated Goal: home  Plan Discharge plan remains appropriate    Co-evaluation  End of Session Equipment Utilized During Treatment: Right knee immobilizer;Rolling walker   Activity Tolerance Patient tolerated treatment well   Patient Left in chair;with call bell/phone within reach;with chair alarm set   Nurse Communication          Time: [phone] OT Time Calculation (min): 13 min  Charges: OT General Charges $OT Visit: 1 Procedure OT Treatments $Self Care/Home Management : 8-22 mins  Veeda Virgo A 11/30/2015, 11:12 AM

## 2015-11-30 NOTE — Progress Notes (Signed)
Physical Therapy Treatment Patient Details Name: Carl Sutton MRN: [id number] DOB: 05/14/1940 Today's Date: 11/30/2015    History of Present Illness 75 yo male s/p R TKA 11/28/15    PT Comments    Progressing with mobility. Practiced/reviewed exercises, gait training, and stair training. Will plan to have a 2nd session this afternoon prior to d/c. Highly recommend 24 hour supervision/assist.   Follow Up Recommendations  Home health PT;Supervision/Assistance - 24 hour     Equipment Recommendations  None recommended by PT    Recommendations for Other Services       Precautions / Restrictions Precautions Precautions: Fall;Knee Required Braces or Orthoses: Knee Immobilizer - Right Knee Immobilizer - Right: Discontinue once straight leg raise with < 10 degree lag Restrictions Weight Bearing Restrictions: No RLE Weight Bearing: Weight bearing as tolerated    Mobility  Bed Mobility Overal bed mobility: Needs Assistance Bed Mobility: Supine to Sit     Supine to sit: Min assist;HOB elevated     General bed mobility comments: Assist for R LE  Transfers Overall transfer level: Needs assistance Equipment used: Rolling walker (2 wheeled) Transfers: Sit to/from Stand Sit to Stand: Min assist         General transfer comment: VCs safety, technique, hand/LE placement. Assist to rise, stabilize, control descent.   Ambulation/Gait Ambulation/Gait assistance: Min assist Ambulation Distance (Feet): 100 Feet Assistive device: Rolling walker (2 wheeled) Gait Pattern/deviations: Step-to pattern     General Gait Details: VCs safety, technique, sequence. Assist to stabilize intermittently.    Stairs Stairs: Yes   Stair Management: Step to pattern;Backwards;Forwards;With walker Number of Stairs: 1 General stair comments: VCs safety, technique, sequence.  Attempted going up forwards but pt was unable to step up. Had pt practice backwards instead. Pt states his step is not  as tall as therapy step.   Wheelchair Mobility    Modified Rankin (Stroke Patients Only)       Balance                                    Cognition Arousal/Alertness: Awake/alert Behavior During Therapy: WFL for tasks assessed/performed Overall Cognitive Status: Within Functional Limits for tasks assessed                      Exercises Total Joint Exercises Ankle Circles/Pumps: PROM;Both;10 reps;Supine Quad Sets: AROM;Both;10 reps;Supine Heel Slides: AAROM;Right;10 reps;Supine Hip ABduction/ADduction: AAROM;Right;10 reps;Supine Straight Leg Raises: AAROM;10 reps;Right;Supine Goniometric ROM: ~10-60 degrees    General Comments        Pertinent Vitals/Pain Pain Assessment: 0-10 Pain Score: 5  Pain Location: R knee, back, hips Pain Descriptors / Indicators: Aching;Sore Pain Intervention(s): Monitored during session;Repositioned;Ice applied    Home Living                      Prior Function            PT Goals (current goals can now be found in the care plan section) Progress towards PT goals: Progressing toward goals    Frequency    7X/week      PT Plan Current plan remains appropriate    Co-evaluation             End of Session Equipment Utilized During Treatment: Gait belt;Right knee immobilizer Activity Tolerance: Patient tolerated treatment well Patient left: in chair;with call bell/phone within reach;with chair alarm set  Time: [id number] PT Time Calculation (min) (ACUTE ONLY): 32 min  Charges:  $Gait Training: 8-22 mins $Therapeutic Exercise: 8-22 mins                    G Codes:      Weston Anna, MPT Pager: [phone]

## 2015-11-30 NOTE — Progress Notes (Signed)
Physical Therapy Treatment Patient Details Name: ISSAC WILCOTT MRN: [id number] DOB: Jun 28, 1940 Today's Date: 11/30/2015    History of Present Illness 76 yo male s/p R TKA 11/28/15    PT Comments    Reviewed/practiced transfers, gait training. Verbally reviewed negotiation of 1 step with pt and friend who will be helping at home. Explained to friend that she will likely have to physically assist him at times-she acknowledged this. All education completed. No further questions/concerns from pt and friend.    Follow Up Recommendations  Home health PT;Supervision/Assistance - 24 hour     Equipment Recommendations  None recommended by PT    Recommendations for Other Services       Precautions / Restrictions Precautions Precautions: Fall;Knee Required Braces or Orthoses: Knee Immobilizer - Right Knee Immobilizer - Right: Discontinue once straight leg raise with < 10 degree lag Restrictions Weight Bearing Restrictions: No RLE Weight Bearing: Weight bearing as tolerated    Mobility  Bed Mobility               General bed mobility comments: NT -- OOB in recliner  Transfers Overall transfer level: Needs assistance Equipment used: Rolling walker (2 wheeled) Transfers: Sit to/from Stand Sit to Stand: Min assist         General transfer comment: x2 for practice, instruction. Assist to rise, stabilize, control descent. VCs safety, technique, hand/LE placement.   Ambulation/Gait Ambulation/Gait assistance: Min guard Ambulation Distance (Feet): 100 Feet Assistive device: Rolling walker (2 wheeled) Gait Pattern/deviations: Step-to pattern;Step-through pattern;Decreased stride length     General Gait Details: VCs safety, technique, sequence. Close guard for safety.    Stairs            Wheelchair Mobility    Modified Rankin (Stroke Patients Only)       Balance                                    Cognition Arousal/Alertness:  Awake/alert Behavior During Therapy: WFL for tasks assessed/performed Overall Cognitive Status: Within Functional Limits for tasks assessed                      Exercises      General Comments        Pertinent Vitals/Pain Pain Assessment: 0-10 Pain Score: 5  Pain Location: R knee Pain Descriptors / Indicators: Sore Pain Intervention(s): Monitored during session    Home Living                      Prior Function            PT Goals (current goals can now be found in the care plan section) Acute Rehab PT Goals Patient Stated Goal: home Progress towards PT goals: Progressing toward goals    Frequency    7X/week      PT Plan Current plan remains appropriate    Co-evaluation             End of Session Equipment Utilized During Treatment: Gait belt;Right knee immobilizer Activity Tolerance: Patient tolerated treatment well Patient left: in chair;with call bell/phone within reach;with family/visitor present     Time: 1315-1330 PT Time Calculation (min) (ACUTE ONLY): 15 min  Charges:  $Gait Training: 8-22 mins                    G Codes:  Weston Anna, MPT Pager: [phone]

## 2015-12-03 NOTE — Discharge Summary (Signed)
Physician Discharge Summary   Patient ID: EUGENE ISADORE MRN: [id number] DOB/AGE: 1940/12/26 75 y.o.  Admit date: 11/28/2015 Discharge date: 11/30/2015  Primary Diagnosis: Primary osteoarthritis right knee  Admission Diagnoses:  Past Medical History:  Diagnosis Date  . Arthritis    oa  . Cancer ([place])    skin cancer area removed basal cell  . Coronary artery disease   . Diabetes mellitus without complication ([place])   . Gallstones   . GERD (gastroesophageal reflux disease)   . History of kidney stones yrs ago  . Hyperlipidemia   . Hypertension    Discharge Diagnoses:   Active Problems:   S/P TKR (total knee replacement) using cement, right  Estimated body mass index is 31.63 kg/m as calculated from the following:   Height as of this encounter: 5' 7.5" (1.715 m).   Weight as of this encounter: 93 kg (205 lb).  Procedure:  Procedure(s) (LRB): RIGHT TOTAL KNEE ARTHROPLASTY (Right)   Consults: None  HPI: Corine Shelter, 75 y.o. male, has a history of pain and functional disability in the right knee due to arthritis and has failed non-surgical conservative treatments for greater than 12 weeks to includeNSAID's and/or analgesics, corticosteriod injections, viscosupplementation injections, weight reduction as appropriate and activity modification.  Onset of symptoms was gradual, starting 5 years ago with gradually worsening course since that time. The patient noted prior procedures on the knee to include  arthroscopy and menisectomy on the right knee(s).  Patient currently rates pain in the right knee(s) at 8 out of 10 with activity. Patient has night pain, worsening of pain with activity and weight bearing, pain that interferes with activities of daily living, pain with passive range of motion, crepitus and joint swelling.  Patient has evidence of periarticular osteophytes and joint space narrowing by imaging studies. There is no active infection.  Laboratory Data: Admission on  11/28/2015, Discharged on 11/30/2015  Component Date Value Ref Range Status  . Glucose-Capillary 11/28/2015 200* 65 - 99 mg/dL Final  . Comment 1 11/28/2015 Notify RN   Final  . Glucose-Capillary 11/28/2015 199* 65 - 99 mg/dL Final  . Comment 1 11/28/2015 Notify RN   Final  . Glucose-Capillary 11/28/2015 185* 65 - 99 mg/dL Final  . WBC 11/29/2015 14.5* 4.0 - 10.5 K/uL Final  . RBC 11/29/2015 3.52* 4.22 - 5.81 MIL/uL Final  . Hemoglobin 11/29/2015 10.9* 13.0 - 17.0 g/dL Final  . HCT 11/29/2015 32.2* 39.0 - 52.0 % Final  . MCV 11/29/2015 91.5  78.0 - 100.0 fL Final  . MCH 11/29/2015 31.0  26.0 - 34.0 pg Final  . MCHC 11/29/2015 33.9  30.0 - 36.0 g/dL Final  . RDW 11/29/2015 16.1* 11.5 - 15.5 % Final  . Platelets 11/29/2015 162  150 - 400 K/uL Final  . Sodium 11/29/2015 135  135 - 145 mmol/L Final  . Potassium 11/29/2015 3.9  3.5 - 5.1 mmol/L Final  . Chloride 11/29/2015 102  101 - 111 mmol/L Final  . CO2 11/29/2015 25  22 - 32 mmol/L Final  . Glucose, Bld 11/29/2015 223* 65 - 99 mg/dL Final  . BUN 11/29/2015 9  6 - 20 mg/dL Final  . Creatinine, Ser 11/29/2015 0.70  0.61 - 1.24 mg/dL Final  . Calcium 11/29/2015 8.9  8.9 - 10.3 mg/dL Final  . GFR calc non Af Amer 11/29/2015 >60  >60 mL/min Final  . GFR calc Af Amer 11/29/2015 >60  >60 mL/min Final   Comment: (NOTE) The eGFR has been  calculated using the CKD EPI equation. This calculation has not been validated in all clinical situations. eGFR's persistently <60 mL/min signify possible Chronic Kidney Disease.   . Anion gap 11/29/2015 8  5 - 15 Final  . Glucose-Capillary 11/28/2015 162* 65 - 99 mg/dL Final  . Comment 1 11/28/2015 Notify RN   Final  . Comment 2 11/28/2015 Document in Chart   Final  . Glucose-Capillary 11/29/2015 218* 65 - 99 mg/dL Final  . Glucose-Capillary 11/29/2015 205* 65 - 99 mg/dL Final  . WBC 11/30/2015 20.2* 4.0 - 10.5 K/uL Final  . RBC 11/30/2015 3.31* 4.22 - 5.81 MIL/uL Final  . Hemoglobin 11/30/2015 10.3*  13.0 - 17.0 g/dL Final  . HCT 11/30/2015 30.2* 39.0 - 52.0 % Final  . MCV 11/30/2015 91.2  78.0 - 100.0 fL Final  . MCH 11/30/2015 31.1  26.0 - 34.0 pg Final  . MCHC 11/30/2015 34.1  30.0 - 36.0 g/dL Final  . RDW 11/30/2015 15.9* 11.5 - 15.5 % Final  . Platelets 11/30/2015 151  150 - 400 K/uL Final  . Sodium 11/30/2015 136  135 - 145 mmol/L Final  . Potassium 11/30/2015 4.1  3.5 - 5.1 mmol/L Final  . Chloride 11/30/2015 104  101 - 111 mmol/L Final  . CO2 11/30/2015 25  22 - 32 mmol/L Final  . Glucose, Bld 11/30/2015 197* 65 - 99 mg/dL Final  . BUN 11/30/2015 8  6 - 20 mg/dL Final  . Creatinine, Ser 11/30/2015 0.62  0.61 - 1.24 mg/dL Final  . Calcium 11/30/2015 9.2  8.9 - 10.3 mg/dL Final  . GFR calc non Af Amer 11/30/2015 >60  >60 mL/min Final  . GFR calc Af Amer 11/30/2015 >60  >60 mL/min Final   Comment: (NOTE) The eGFR has been calculated using the CKD EPI equation. This calculation has not been validated in all clinical situations. eGFR's persistently <60 mL/min signify possible Chronic Kidney Disease.   . Anion gap 11/30/2015 7  5 - 15 Final  . Glucose-Capillary 11/29/2015 190* 65 - 99 mg/dL Final  . Glucose-Capillary 11/30/2015 206* 65 - 99 mg/dL Final  . Glucose-Capillary 11/30/2015 181* 65 - 99 mg/dL Final  Hospital Outpatient Visit on 11/22/2015  Component Date Value Ref Range Status  . Hgb A1c MFr Bld 11/23/2015 6.5* 4.8 - 5.6 % Final   Comment: (NOTE)         Pre-diabetes: 5.7 - 6.4         Diabetes: >6.4         Glycemic control for adults with diabetes: <7.0   . Mean Plasma Glucose 11/23/2015 140  mg/dL Final   Comment: (NOTE) Performed At: [facility] [place], [place] [id number] Lindon Romp MD [id number]   . aPTT 11/22/2015 30  24 - 36 seconds Final  . WBC 11/22/2015 8.5  4.0 - 10.5 K/uL Final  . RBC 11/22/2015 4.42  4.22 - 5.81 MIL/uL Final  . Hemoglobin 11/22/2015 13.9  13.0 - 17.0 g/dL Final  . HCT 11/22/2015 40.1  39.0 -  52.0 % Final  . MCV 11/22/2015 90.7  78.0 - 100.0 fL Final  . MCH 11/22/2015 31.4  26.0 - 34.0 pg Final  . MCHC 11/22/2015 34.7  30.0 - 36.0 g/dL Final  . RDW 11/22/2015 15.8* 11.5 - 15.5 % Final  . Platelets 11/22/2015 186  150 - 400 K/uL Final  . Neutrophils Relative % 11/22/2015 63  % Final  . Neutro Abs 11/22/2015 5.3  1.7 -  7.7 K/uL Final  . Lymphocytes Relative 11/22/2015 20  % Final  . Lymphs Abs 11/22/2015 1.7  0.7 - 4.0 K/uL Final  . Monocytes Relative 11/22/2015 16  % Final  . Monocytes Absolute 11/22/2015 1.4* 0.1 - 1.0 K/uL Final  . Eosinophils Relative 11/22/2015 1  % Final  . Eosinophils Absolute 11/22/2015 0.1  0.0 - 0.7 K/uL Final  . Basophils Relative 11/22/2015 0  % Final  . Basophils Absolute 11/22/2015 0.0  0.0 - 0.1 K/uL Final  . Sodium 11/22/2015 139  135 - 145 mmol/L Final  . Potassium 11/22/2015 4.8  3.5 - 5.1 mmol/L Final  . Chloride 11/22/2015 105  101 - 111 mmol/L Final  . CO2 11/22/2015 27  22 - 32 mmol/L Final  . Glucose, Bld 11/22/2015 101* 65 - 99 mg/dL Final  . BUN 11/22/2015 9  6 - 20 mg/dL Final  . Creatinine, Ser 11/22/2015 0.63  0.61 - 1.24 mg/dL Final  . Calcium 11/22/2015 9.8  8.9 - 10.3 mg/dL Final  . Total Protein 11/22/2015 7.7  6.5 - 8.1 g/dL Final  . Albumin 11/22/2015 4.5  3.5 - 5.0 g/dL Final  . AST 11/22/2015 23  15 - 41 U/L Final  . ALT 11/22/2015 19  17 - 63 U/L Final  . Alkaline Phosphatase 11/22/2015 61  38 - 126 U/L Final  . Total Bilirubin 11/22/2015 0.7  0.3 - 1.2 mg/dL Final  . GFR calc non Af Amer 11/22/2015 >60  >60 mL/min Final  . GFR calc Af Amer 11/22/2015 >60  >60 mL/min Final   Comment: (NOTE) The eGFR has been calculated using the CKD EPI equation. This calculation has not been validated in all clinical situations. eGFR's persistently <60 mL/min signify possible Chronic Kidney Disease.   . Anion gap 11/22/2015 7  5 - 15 Final  . Prothrombin Time 11/22/2015 12.9  11.4 - 15.2 seconds Final  . INR 11/22/2015 0.97    Final  . ABO/RH(D) 11/28/2015 A POS   Final  . Antibody Screen 11/28/2015 NEG   Final  . Sample Expiration 11/28/2015 12/01/2015   Final  . Extend sample reason 11/28/2015 NO TRANSFUSIONS OR PREGNANCY IN THE PAST 3 MONTHS   Final  . Color, Urine 11/22/2015 YELLOW  YELLOW Final  . APPearance 11/22/2015 CLEAR  CLEAR Final  . Specific Gravity, Urine 11/22/2015 1.012  1.005 - 1.030 Final  . pH 11/22/2015 6.0  5.0 - 8.0 Final  . Glucose, UA 11/22/2015 100* NEGATIVE mg/dL Final  . Hgb urine dipstick 11/22/2015 NEGATIVE  NEGATIVE Final  . Bilirubin Urine 11/22/2015 NEGATIVE  NEGATIVE Final  . Ketones, ur 11/22/2015 NEGATIVE  NEGATIVE mg/dL Final  . Protein, ur 11/22/2015 NEGATIVE  NEGATIVE mg/dL Final  . Nitrite 11/22/2015 NEGATIVE  NEGATIVE Final  . Leukocytes, UA 11/22/2015 TRACE* NEGATIVE Final  . ABO/RH(D) 11/22/2015 A POS   Final  . Squamous Epithelial / LPF 11/22/2015 0-5* NONE SEEN Final  . WBC, UA 11/22/2015 0-5  0 - 5 WBC/hpf Final  . RBC / HPF 11/22/2015 NONE SEEN  0 - 5 RBC/hpf Final  . Bacteria, UA 11/22/2015 NONE SEEN  NONE SEEN Final  . MRSA, PCR 11/23/2015 NEGATIVE  NEGATIVE Final  . Staphylococcus aureus 11/23/2015 NEGATIVE  NEGATIVE Final   Comment:        The Xpert SA Assay (FDA approved for NASAL specimens in patients over 69 years of age), is one component of a comprehensive surveillance program.  Test performance has been validated by [facility] for patients greater than or equal to 51 year old. It is not intended to diagnose infection nor to guide or monitor treatment.      Hospital Course: SANTIEL TOPPER is a 75 y.o. who was admitted to [facility]. They were brought to the operating room on 11/28/2015 and underwent Procedure(s): RIGHT TOTAL KNEE ARTHROPLASTY.  Patient tolerated the procedure well and was later transferred to the recovery room and then to the orthopaedic floor for postoperative care.  They were given PO and IV analgesics for pain  control following their surgery.  They were given 24 hours of postoperative antibiotics of  Anti-infectives    Start     Dose/Rate Route Frequency Ordered Stop   11/28/15 1400  ceFAZolin (ANCEF) IVPB 1 g/50 mL premix     1 g 100 mL/hr over 30 Minutes Intravenous Every 6 hours 11/28/15 1200 11/28/15 2053   11/28/15 0906  polymyxin B 500,000 Units, bacitracin 50,000 Units in sodium chloride irrigation 0.9 % 500 mL irrigation  Status:  Discontinued       As needed 11/28/15 0906 11/28/15 1028   11/28/15 0622  ceFAZolin (ANCEF) IVPB 2g/100 mL premix     2 g 200 mL/hr over 30 Minutes Intravenous On call to O.R. 11/28/15 9758 11/28/15 8325     and started on DVT prophylaxis in the form of Xarelto.   PT and OT were ordered for total joint protocol.  Discharge planning consulted to help with postop disposition and equipment needs.  Patient had a fair night on the evening of surgery.  They started to get up OOB with therapy on day one. Hemovac drain was pulled without difficulty.  Continued to work with therapy into day two.  Dressing was changed on day two and the incision was clean and dry. Patient was voiding well. Bowels were slow moving at first but patient improved post op day two.  The patient had progressed with therapy and meeting their goals.  Incision was healing well.  Patient was seen in rounds and was ready to go home. Patient was transitioned to aspirin for DVT prophylaxis upon discharge.    Diet: Cardiac diet and Diabetic diet Activity:WBAT Follow-up:in 2 weeks Disposition - Home Discharged Condition: stable   Discharge Instructions    Call MD / Call 911    Complete by:  As directed    If you experience chest pain or shortness of breath, CALL 911 and be transported to the hospital emergency room.  If you develope a fever above 101 F, pus (white drainage) or increased drainage or redness at the wound, or calf pain, call your surgeon's office.   Constipation Prevention    Complete by:   As directed    Drink plenty of fluids.  Prune juice may be helpful.  You may use a stool softener, such as Colace (over the counter) 100 mg twice a day.  Use MiraLax (over the counter) for constipation as needed.   Diet - low sodium heart healthy    Complete by:  As directed    Diet Carb Modified    Complete by:  As directed    Discharge instructions    Complete by:  As directed    INSTRUCTIONS AFTER JOINT REPLACEMENT   Remove items at home which could result in a fall. This includes throw rugs or furniture in walking pathways ICE to the affected joint every three hours while awake for 30 minutes at a time, for at least the  first 3-5 days, and then as needed for pain and swelling.  Continue to use ice for pain and swelling. You may notice swelling that will progress down to the foot and ankle.  This is normal after surgery.  Elevate your leg when you are not up walking on it.   Continue to use the breathing machine you got in the hospital (incentive spirometer) which will help keep your temperature down.  It is common for your temperature to cycle up and down following surgery, especially at night when you are not up moving around and exerting yourself.  The breathing machine keeps your lungs expanded and your temperature down.   DIET:  As you were doing prior to hospitalization, we recommend a well-balanced diet.  DRESSING / WOUND CARE / SHOWERING  You may change your dressing every day with sterile gauze.  Please use good hand washing techniques before changing the dressing.  Do not use any lotions or creams on the incision until instructed by your surgeon.  ACTIVITY  Increase activity slowly as tolerated, but follow the weight bearing instructions below.   No driving for 6 weeks or until further direction given by your physician.  You cannot drive while taking narcotics.  No lifting or carrying greater than 10 lbs. until further directed by your surgeon. Avoid periods of inactivity such  as sitting longer than an hour when not asleep. This helps prevent blood clots.  You may return to work once you are authorized by your doctor.     WEIGHT BEARING   Weight bearing as tolerated with assist device (walker, cane, etc) as directed, use it as long as suggested by your surgeon or therapist, typically at least 4-6 weeks.   EXERCISES  Results after joint replacement surgery are often greatly improved when you follow the exercise, range of motion and muscle strengthening exercises prescribed by your doctor. Safety measures are also important to protect the joint from further injury. Any time any of these exercises cause you to have increased pain or swelling, decrease what you are doing until you are comfortable again and then slowly increase them. If you have problems or questions, call your caregiver or physical therapist for advice.   Rehabilitation is important following a joint replacement. After just a few days of immobilization, the muscles of the leg can become weakened and shrink (atrophy).  These exercises are designed to build up the tone and strength of the thigh and leg muscles and to improve motion. Often times heat used for twenty to thirty minutes before working out will loosen up your tissues and help with improving the range of motion but do not use heat for the first two weeks following surgery (sometimes heat can increase post-operative swelling).   These exercises can be done on a training (exercise) mat, on the floor, on a table or on a bed. Use whatever works the best and is most comfortable for you.    Use music or television while you are exercising so that the exercises are a pleasant break in your day. This will make your life better with the exercises acting as a break in your routine that you can look forward to.   Perform all exercises about fifteen times, three times per day or as directed.  You should exercise both the operative leg and the other leg as  well.  Exercises include:   Quad Sets - Tighten up the muscle on the front of the thigh (Quad) and hold for 5-10  seconds.   Straight Leg Raises - With your knee straight (if you were given a brace, keep it on), lift the leg to 60 degrees, hold for 3 seconds, and slowly lower the leg.  Perform this exercise against resistance later as your leg gets stronger.  Leg Slides: Lying on your back, slowly slide your foot toward your buttocks, bending your knee up off the floor (only go as far as is comfortable). Then slowly slide your foot back down until your leg is flat on the floor again.  Angel Wings: Lying on your back spread your legs to the side as far apart as you can without causing discomfort.  Hamstring Strength:  Lying on your back, push your heel against the floor with your leg straight by tightening up the muscles of your buttocks.  Repeat, but this time bend your knee to a comfortable angle, and push your heel against the floor.  You may put a pillow under the heel to make it more comfortable if necessary.   A rehabilitation program following joint replacement surgery can speed recovery and prevent re-injury in the future due to weakened muscles. Contact your doctor or a physical therapist for more information on knee rehabilitation.    CONSTIPATION  Constipation is defined medically as fewer than three stools per week and severe constipation as less than one stool per week.  Even if you have a regular bowel pattern at home, your normal regimen is likely to be disrupted due to multiple reasons following surgery.  Combination of anesthesia, postoperative narcotics, change in appetite and fluid intake all can affect your bowels.   YOU MUST use at least one of the following options; they are listed in order of increasing strength to get the job done.  They are all available over the counter, and you may need to use some, POSSIBLY even all of these options:    Drink plenty of fluids (prune juice  may be helpful) and high fiber foods Colace 100 mg by mouth twice a day  Senokot for constipation as directed and as needed Dulcolax (bisacodyl), take with full glass of water  Miralax (polyethylene glycol) once or twice a day as needed.  If you have tried all these things and are unable to have a bowel movement in the first 3-4 days after surgery call either your surgeon or your primary doctor.    If you experience loose stools or diarrhea, hold the medications until you stool forms back up.  If your symptoms do not get better within 1 week or if they get worse, check with your doctor.  If you experience "the worst abdominal pain ever" or develop nausea or vomiting, please contact the office immediately for further recommendations for treatment.   ITCHING:  If you experience itching with your medications, try taking only a single pain pill, or even half a pain pill at a time.  You can also use Benadryl over the counter for itching or also to help with sleep.   TED HOSE STOCKINGS:  Use stockings on both legs until for at least 2 weeks or as directed by physician office. They may be removed at night for sleeping.  MEDICATIONS:  See your medication summary on the "After Visit Summary" that nursing will review with you.  You may have some home medications which will be placed on hold until you complete the course of blood thinner medication.  It is important for you to complete the blood thinner medication as prescribed.  PRECAUTIONS:  If you experience chest pain or shortness of breath - call 911 immediately for transfer to the hospital emergency department.   If you develop a fever greater that 101 F, purulent drainage from wound, increased redness or drainage from wound, foul odor from the wound/dressing, or calf pain - CONTACT YOUR SURGEON.                                                   FOLLOW-UP APPOINTMENTS:  If you do not already have a post-op appointment, please call the office for an  appointment to be seen by your surgeon.  Guidelines for how soon to be seen are listed in your "After Visit Summary", but are typically between 1-4 weeks after surgery.   MAKE SURE YOU:  Understand these instructions.  Get help right away if you are not doing well or get worse.    Thank you for letting us be a part of your medical care team.  It is a privilege we respect greatly.  We hope these instructions will help you stay on track for a fast and full recovery!   Increase activity slowly as tolerated    Complete by:  As directed        Medication List    TAKE these medications   aspirin 325 MG tablet Take 1 tablet (325 mg total) by mouth 2 (two) times daily. To prevent blood clots What changed:  when to take this  additional instructions   atorvastatin 40 MG tablet Commonly known as:  LIPITOR Take 40 mg by mouth every evening.   docusate sodium 100 MG capsule Commonly known as:  COLACE Take 1 capsule (100 mg total) by mouth 2 (two) times daily.   furosemide 40 MG tablet Commonly known as:  LASIX Take 40 mg by mouth daily as needed.   Garlic 5277 MG Caps Take 1,000 mg by mouth 3 (three) times daily.   HUMALOG KWIKPEN 100 UNIT/ML KiwkPen Generic drug:  insulin lispro Inject 15 Units into the skin as needed (for high blood sugar).   LEVEMIR FLEXPEN 100 UNIT/ML Pen Generic drug:  Insulin Detemir Inject 35 Units into the skin 2 (two) times daily.   lisinopril 10 MG tablet Commonly known as:  PRINIVIL,ZESTRIL Take 10 mg by mouth daily.   metFORMIN 1000 MG tablet Commonly known as:  GLUCOPHAGE Take 1,000 mg by mouth 2 (two) times daily.   methocarbamol 500 MG tablet Commonly known as:  ROBAXIN Take 1 tablet (500 mg total) by mouth every 6 (six) hours as needed for muscle spasms.   metoprolol tartrate 25 MG tablet Commonly known as:  LOPRESSOR Take 25 mg by mouth 2 (two) times daily.   MULTIVITAMIN PO Take 1 tablet by mouth daily.   omeprazole 40 MG  capsule Commonly known as:  PRILOSEC Take 40 mg by mouth daily.   oxyCODONE-acetaminophen 5-325 MG tablet Commonly known as:  PERCOCET/ROXICET Take 1-2 tablets by mouth every 4 (four) hours as needed for moderate pain.   polyethylene glycol packet Commonly known as:  MIRALAX / GLYCOLAX Take 17 g by mouth daily as needed for mild constipation.   Vitamin D 2000 units Caps Take 2,000 Units by mouth daily.      Follow-up Information    [facility] .   Why:  now known as [facility];  you will receive a call from this agency to schedule your at home health physical therapy Contact information: [address] [place] [address] [phone]        Tobi Bastos, MD. Schedule an appointment as soon as possible for a visit in 2 week(s).   Specialty:  Orthopedic Surgery Contact information: [address] [place] [address] [phone]           Signed: Ardeen Jourdain, PA-C Orthopaedic Surgery 12/03/2015, 9:04 AM

## 2018-04-01 ENCOUNTER — Telehealth: Payer: Self-pay

## 2018-04-01 ENCOUNTER — Other Ambulatory Visit: Payer: Self-pay | Admitting: Orthopedic Surgery

## 2018-04-01 DIAGNOSIS — M545 Low back pain, unspecified: Secondary | ICD-10-CM

## 2018-04-01 DIAGNOSIS — K635 Polyp of colon: Secondary | ICD-10-CM | POA: Insufficient documentation

## 2018-04-01 DIAGNOSIS — I10 Essential (primary) hypertension: Secondary | ICD-10-CM | POA: Insufficient documentation

## 2018-04-01 DIAGNOSIS — I2510 Atherosclerotic heart disease of native coronary artery without angina pectoris: Secondary | ICD-10-CM | POA: Insufficient documentation

## 2018-04-01 DIAGNOSIS — M1990 Unspecified osteoarthritis, unspecified site: Secondary | ICD-10-CM | POA: Insufficient documentation

## 2018-04-01 DIAGNOSIS — E785 Hyperlipidemia, unspecified: Secondary | ICD-10-CM | POA: Insufficient documentation

## 2018-04-01 DIAGNOSIS — K8020 Calculus of gallbladder without cholecystitis without obstruction: Secondary | ICD-10-CM | POA: Insufficient documentation

## 2018-04-01 DIAGNOSIS — R011 Cardiac murmur, unspecified: Secondary | ICD-10-CM | POA: Insufficient documentation

## 2018-04-01 DIAGNOSIS — E119 Type 2 diabetes mellitus without complications: Secondary | ICD-10-CM | POA: Insufficient documentation

## 2018-04-01 DIAGNOSIS — C44621 Squamous cell carcinoma of skin of unspecified upper limb, including shoulder: Secondary | ICD-10-CM | POA: Insufficient documentation

## 2018-04-01 DIAGNOSIS — IMO0002 Reserved for concepts with insufficient information to code with codable children: Secondary | ICD-10-CM | POA: Insufficient documentation

## 2018-04-01 DIAGNOSIS — G8929 Other chronic pain: Secondary | ICD-10-CM

## 2018-04-01 NOTE — Telephone Encounter (Signed)
Spoke with patient when he returned my call to screen his medications and drug allergies prior to being scheduled for a myelogram.  Explained to him he will be at our office 2-2.5 hours, will need a driver and will need to be on bedrest for 24 hours afterwards.  He does not need to hold any medications, not even his aspirin 325mg .

## 2018-04-09 ENCOUNTER — Ambulatory Visit
Admission: RE | Admit: 2018-04-09 | Discharge: 2018-04-09 | Disposition: A | Discharge status: Home / Self Care | Payer: Medicare Other | Source: Ambulatory Visit | Attending: Orthopedic Surgery | Admitting: Orthopedic Surgery

## 2018-04-09 DIAGNOSIS — M545 Low back pain, unspecified: Secondary | ICD-10-CM

## 2018-04-09 DIAGNOSIS — G8929 Other chronic pain: Secondary | ICD-10-CM

## 2018-04-09 MED ORDER — DIAZEPAM 5 MG PO TABS: 5.0000 mg | ORAL_TABLET | Freq: Once | ORAL | Status: DC

## 2018-04-09 MED ORDER — IOPAMIDOL (ISOVUE-M 200) INJECTION 41%
15.0000 mL | Freq: Once | INTRAMUSCULAR | Status: AC
  Administered 2018-04-09: 15 mL via INTRATHECAL

## 2018-04-09 NOTE — Discharge Instructions (Signed)

## 2018-04-27 NOTE — Patient Instructions (Addendum)
Carl Sutton  04/27/2018   Your procedure is scheduled on: 05-05-18    Report to [facility] Main  Entrance    Report to Admitting at 6:30 AM    Call this number if you have problems the morning of surgery [phone]    Remember: Do not eat food or drink liquids :After Midnight.    BRUSH YOUR TEETH MORNING OF SURGERY AND RINSE YOUR MOUTH OUT, NO CHEWING GUM CANDY OR MINTS.     Take these medicines the morning of surgery with A SIP OF WATER: Metoprolol Tartrate (Lopressor), and Omeprazole (Prilosec)   DO NOT TAKE ANY DIABETIC MEDICATIONS DAY OF YOUR SURGERY                               You may not have any metal on your body including hair pins and              piercings  Do not wear jewelry, cologne, lotions, powders or deodorant             Men may shave face and neck.   Do not bring valuables to the hospital. [place].  Contacts, dentures or bridgework may not be worn into surgery.  Leave suitcase in the car. After surgery it may be brought to your room.     Patients discharged the day of surgery will not be allowed to drive home. IF YOU ARE HAVING SURGERY AND GOING HOME THE SAME DAY, YOU MUST HAVE AN ADULT TO DRIVE YOU HOME AND BE WITH YOU FOR 24 HOURS. YOU MAY GO HOME BY TAXI OR UBER OR ORTHERWISE, BUT AN ADULT MUST ACCOMPANY YOU HOME AND STAY WITH YOU FOR 24 HOURS.    Special Instructions: N/A              Please read over the following fact sheets you were given: _____________________________________________________________________             How to Manage Your Diabetes Before and After Surgery  Why is it important to control my blood sugar before and after surgery? . Improving blood sugar levels before and after surgery helps healing and can limit problems. . A way of improving blood sugar control is eating a healthy diet by: o  Eating less sugar and carbohydrates o  Increasing  activity/exercise o  Talking with your doctor about reaching your blood sugar goals . High blood sugars (greater than 180 mg/dL) can raise your risk of infections and slow your recovery, so you will need to focus on controlling your diabetes during the weeks before surgery. . Make sure that the doctor who takes care of your diabetes knows about your planned surgery including the date and location.  How do I manage my blood sugar before surgery? . Check your blood sugar at least 4 times a day, starting 2 days before surgery, to make sure that the level is not too high or low. o Check your blood sugar the morning of your surgery when you wake up and every 2 hours until you get to the Short Stay unit. . If your blood sugar is less than 70 mg/dL, you will need to treat for low blood sugar: o Do not take insulin. o  Treat a low blood sugar (less than 70 mg/dL) with  cup of clear juice (cranberry or apple), 4 glucose tablets, OR glucose gel. o Recheck blood sugar in 15 minutes after treatment (to make sure it is greater than 70 mg/dL). If your blood sugar is not greater than 70 mg/dL on recheck, call [phone] for further instructions. . Report your blood sugar to the short stay nurse when you get to Short Stay.  . If you are admitted to the hospital after surgery: o Your blood sugar will be checked by the staff and you will probably be given insulin after surgery (instead of oral diabetes medicines) to make sure you have good blood sugar levels. o The goal for blood sugar control after surgery is 80-180 mg/dL.   WHAT DO I DO ABOUT MY DIABETES MEDICATION?  Marland Kitchen Do not take oral diabetes medicines (pills) the morning of surgery.  . THE DAY BEFORE SURGERY, take your usual dose of Metformin. Do not take a bedtime dose of Humalog, prn. Take your usual 45 units of Levemir insulin in the morning. However only take 22 units of your Dinner or Bedtime dose of Levemir.       . The day of surgery, do not  take other diabetes injectables, including Byetta (exenatide), Bydureon (exenatide ER), Victoza (liraglutide), or Trulicity (dulaglutide).  . If your CBG is greater than 220 mg/dL, you may take  of your sliding scale  . (correction) dose of insulin.    For patients with insulin pumps: Contact your diabetes doctor for specific instructions before surgery. Decrease basal rates by 20% at midnight the night before your surgery. Note that if your surgery is planned to be longer than 2 hours, your insulin pump will be removed and intravenous (IV) insulin will be started and managed by the nurses and the anesthesiologist. You will be able to restart your insulin pump once you are awake and able to manage it.  Make sure to bring insulin pump supplies to the hospital with you in case the  site needs to be changed.   Reviewed and Endorsed by [facility] Patient Education Committee, August 2015   [facility] - Preparing for Surgery Before surgery, you can play an important role.  Because skin is not sterile, your skin needs to be as free of germs as possible.  You can reduce the number of germs on your skin by washing with CHG (chlorahexidine gluconate) soap before surgery.  CHG is an antiseptic cleaner which kills germs and bonds with the skin to continue killing germs even after washing. Please DO NOT use if you have an allergy to CHG or antibacterial soaps.  If your skin becomes reddened/irritated stop using the CHG and inform your nurse when you arrive at Short Stay. Do not shave (including legs and underarms) for at least 48 hours prior to the first CHG shower.  You may shave your face/neck. Please follow these instructions carefully:  1.  Shower with CHG Soap the night before surgery and the  morning of Surgery.  2.  If you choose to wash your hair, wash your hair first as usual with your  normal  shampoo.  3.  After you shampoo, rinse your hair and body thoroughly to remove the  shampoo.                            4.  Use CHG as you would any other liquid soap.  You can apply chg directly  to the skin and wash                       Gently with a scrungie or clean washcloth.  5.  Apply the CHG Soap to your body ONLY FROM THE NECK DOWN.   Do not use on face/ open                           Wound or open sores. Avoid contact with eyes, ears mouth and genitals (private parts).                       Wash face,  Genitals (private parts) with your normal soap.             6.  Wash thoroughly, paying special attention to the area where your surgery  will be performed.  7.  Thoroughly rinse your body with warm water from the neck down.  8.  DO NOT shower/wash with your normal soap after using and rinsing off  the CHG Soap.                9.  Pat yourself dry with a clean towel.            10.  Wear clean pajamas.            11.  Place clean sheets on your bed the night of your first shower and do not  sleep with pets. Day of Surgery : Do not apply any lotions/deodorants the morning of surgery.  Please wear clean clothes to the hospital/surgery center.  FAILURE TO FOLLOW THESE INSTRUCTIONS MAY RESULT IN THE CANCELLATION OF YOUR SURGERY PATIENT SIGNATURE_________________________________  NURSE SIGNATURE__________________________________  ________________________________________________________________________   [name]  An incentive spirometer is a tool that can help keep your lungs clear and active. This tool measures how well you are filling your lungs with each breath. Taking long deep breaths may help reverse or decrease the chance of developing breathing (pulmonary) problems (especially infection) following:  A long period of time when you are unable to move or be active. BEFORE THE PROCEDURE   If the spirometer includes an indicator to show your best effort, your nurse or respiratory therapist will set it to a desired goal.  If possible, sit up straight or lean slightly  forward. Try not to slouch.  Hold the incentive spirometer in an upright position. INSTRUCTIONS FOR USE  1. Sit on the edge of your bed if possible, or sit up as far as you can in bed or on a chair. 2. Hold the incentive spirometer in an upright position. 3. Breathe out normally. 4. Place the mouthpiece in your mouth and seal your lips tightly around it. 5. Breathe in slowly and as deeply as possible, raising the piston or the ball toward the top of the column. 6. Hold your breath for 3-5 seconds or for as long as possible. Allow the piston or ball to fall to the bottom of the column. 7. Remove the mouthpiece from your mouth and breathe out normally. 8. Rest for a few seconds and repeat Steps 1 through 7 at least 10 times every 1-2 hours when you are awake. Take your time and take a few normal breaths between deep breaths. 9. The spirometer may include an indicator to show your best effort. Use the indicator as a goal to work toward  during each repetition. 10. After each set of 10 deep breaths, practice coughing to be sure your lungs are clear. If you have an incision (the cut made at the time of surgery), support your incision when coughing by placing a pillow or rolled up towels firmly against it. Once you are able to get out of bed, walk around indoors and cough well. You may stop using the incentive spirometer when instructed by your caregiver.  RISKS AND COMPLICATIONS  Take your time so you do not get dizzy or light-headed.  If you are in pain, you may need to take or ask for pain medication before doing incentive spirometry. It is harder to take a deep breath if you are having pain. AFTER USE  Rest and breathe slowly and easily.  It can be helpful to keep track of a log of your progress. Your caregiver can provide you with a simple table to help with this. If you are using the spirometer at home, follow these instructions: [place] IF:   You are having difficultly using the  spirometer.  You have trouble using the spirometer as often as instructed.  Your pain medication is not giving enough relief while using the spirometer.  You develop fever of 100.5 F (38.1 C) or higher. SEEK IMMEDIATE MEDICAL CARE IF:   You cough up bloody sputum that had not been present before.  You develop fever of 102 F (38.9 C) or greater.  You develop worsening pain at or near the incision site. MAKE SURE YOU:   Understand these instructions.  Will watch your condition.  Will get help right away if you are not doing well or get worse. Document Released: 06/23/2006 Document Revised: 05/05/2011 Document Reviewed: 08/24/2006 [facility] Patient Information 2014 [place], [place].   ________________________________________________________________________

## 2018-04-28 NOTE — H&P (Signed)
Carl Sutton is an 78 y.o. male.   Chief Complaint: low back pain HPI: The patient presented to the office with the chief complaint of low back pain. He has been dealing with this since the beginning of the year. He denies injury. He reports right leg pain and weakness as well. No improvement with conservative treatments. CT myelogram showed spinal stenosis L3-L4, L4-L5.   Past Medical History:  Diagnosis Date  . Arthritis    oa  . Cancer ([place])    skin cancer area removed basal cell  . Coronary artery disease   . Diabetes mellitus without complication ([place])   . Gallstones   . GERD (gastroesophageal reflux disease)   . History of kidney stones yrs ago  . Hyperlipidemia   . Hypertension     Past Surgical History:  Procedure Laterality Date  . CARDIAC CATHETERIZATION  last 2009  . CORONARY ANGIOPLASTY  1993   with stents done  . CORONARY ARTERY BYPASS GRAFT  1993   x 3  . EXTRACORPOREAL SHOCK WAVE LITHOTRIPSY  4-5- yrs ago  . right rotator cuff repair  2008  . stent to heart   2009   rca  . TOTAL KNEE ARTHROPLASTY Right 11/28/2015   Procedure: RIGHT TOTAL KNEE ARTHROPLASTY;  Surgeon: Latanya Maudlin, MD;  Location: [facility];  Service: Orthopedics;  Laterality: Right;     Social History:  reports that he has been smoking cigarettes. He has a 45.00 pack-year smoking history. He has never used smokeless tobacco. He reports that he does not drink alcohol or use drugs.  Allergies: No Known Allergies   Current Outpatient Medications:  .  aspirin 325 MG tablet, Take 325 mg by mouth daily., Disp: , Rfl:  .  atorvastatin (LIPITOR) 40 MG tablet, Take 40 mg by mouth every evening. , Disp: , Rfl:  .  Cholecalciferol (VITAMIN D) 2000 units CAPS, Take 2,000 Units by mouth daily., Disp: , Rfl:  .  furosemide (LASIX) 40 MG tablet, Take 40 mg by mouth daily as needed for edema. , Disp: , Rfl:  .  Garlic 2426 MG CAPS, Take 3,000 mg by mouth daily. , Disp: , Rfl:  .  HUMALOG KWIKPEN 100  UNIT/ML KiwkPen, Inject 15 Units into the skin as needed (for high blood sugar). , Disp: , Rfl:  .  Insulin Detemir (LEVEMIR FLEXPEN) 100 UNIT/ML Pen, Inject 45 Units into the skin 2 (two) times daily. , Disp: , Rfl:  .  lisinopril (PRINIVIL,ZESTRIL) 40 MG tablet, Take 40 mg by mouth daily. , Disp: , Rfl: 1 .  metFORMIN (GLUCOPHAGE) 1000 MG tablet, Take 1,000 mg by mouth 2 (two) times daily with a meal. , Disp: , Rfl:  .  metoprolol tartrate (LOPRESSOR) 25 MG tablet, Take 25 mg by mouth 2 (two) times daily., Disp: , Rfl:  .  Multiple Vitamins-Minerals (MULTIVITAMIN PO), Take 1 tablet by mouth daily., Disp: , Rfl:  .  omeprazole (PRILOSEC) 40 MG capsule, Take 40 mg by mouth daily., Disp: , Rfl: 1   Review of Systems  Constitutional: Negative.   HENT: Negative.   Eyes: Negative.   Respiratory: Negative.   Cardiovascular: Negative.   Gastrointestinal: Negative.   Genitourinary: Negative.   Musculoskeletal: Positive for back pain, joint pain and myalgias. Negative for falls and neck pain.  Skin: Negative.   Neurological: Positive for tingling. Negative for tremors, sensory change, speech change, focal weakness, seizures, loss of consciousness and weakness.  Endo/Heme/Allergies: Negative.   Psychiatric/Behavioral: Negative.  Vitals Temp (F)   98.4      Pulse Rate   73      Resp   18      BP   161/66      SpO2 (%)   99      Weight (kg)   90.4         Physical Exam  Constitutional: He is oriented to person, place, and time. He appears well-developed. No distress.  Obese  HENT:  Head: Normocephalic and atraumatic.  Right Ear: External ear normal.  Left Ear: External ear normal.  Nose: Nose normal.  Mouth/Throat: Oropharynx is clear and moist.  Eyes: Conjunctivae and EOM are normal.  Neck: Normal range of motion. Neck supple.  Cardiovascular: Normal rate, regular rhythm, normal heart sounds and intact distal pulses.  No murmur heard. Respiratory: Effort normal and  breath sounds normal. No respiratory distress. He has no wheezes.  GI: Soft. Bowel sounds are normal. He exhibits no distension. There is no abdominal tenderness.  Musculoskeletal:     Right hip: Normal.     Left hip: Normal.     Right knee: Normal.     Left knee: He exhibits decreased range of motion and swelling. He exhibits no effusion and no erythema. Tenderness found. Medial joint line tenderness noted. No lateral joint line tenderness noted.     Lumbar back: He exhibits tenderness, pain and spasm. He exhibits no bony tenderness.  Neurological: He is alert and oriented to person, place, and time. He has normal strength. No sensory deficit.  Skin: No rash noted. He is not diaphoretic. No erythema.  Psychiatric: He has a normal mood and affect. His behavior is normal.     Assessment/Plan Lumbar spinal stenosis L3-L4, L4-L5  He needs to have a central decompressive lumbar laminectomy at L3-4 and L4-5 with special attention noted on the right. Risks and benefits of the procedure were discussed with patient by Dr. Gladstone Lighter. He will stay overnight for observation.   Loann Quill, PA-C 05/05/18 0840

## 2018-04-29 ENCOUNTER — Other Ambulatory Visit: Payer: Self-pay

## 2018-04-29 ENCOUNTER — Encounter (HOSPITAL_COMMUNITY)
Admission: RE | Admit: 2018-04-29 | Discharge: 2018-04-29 | Disposition: A | Discharge status: Home / Self Care | Payer: Medicare Other | Source: Ambulatory Visit | Attending: Orthopedic Surgery | Admitting: Orthopedic Surgery

## 2018-04-29 ENCOUNTER — Encounter (HOSPITAL_COMMUNITY): Payer: Self-pay

## 2018-04-29 ENCOUNTER — Ambulatory Visit (HOSPITAL_COMMUNITY)
Admission: RE | Admit: 2018-04-29 | Discharge: 2018-04-29 | Disposition: A | Discharge status: Home / Self Care | Payer: Medicare Other | Source: Ambulatory Visit | Attending: Surgical | Admitting: Surgical

## 2018-04-29 DIAGNOSIS — F17210 Nicotine dependence, cigarettes, uncomplicated: Secondary | ICD-10-CM | POA: Insufficient documentation

## 2018-04-29 DIAGNOSIS — I10 Essential (primary) hypertension: Secondary | ICD-10-CM | POA: Insufficient documentation

## 2018-04-29 DIAGNOSIS — Z955 Presence of coronary angioplasty implant and graft: Secondary | ICD-10-CM | POA: Insufficient documentation

## 2018-04-29 DIAGNOSIS — Z7982 Long term (current) use of aspirin: Secondary | ICD-10-CM | POA: Insufficient documentation

## 2018-04-29 DIAGNOSIS — K219 Gastro-esophageal reflux disease without esophagitis: Secondary | ICD-10-CM | POA: Diagnosis not present

## 2018-04-29 DIAGNOSIS — M48061 Spinal stenosis, lumbar region without neurogenic claudication: Secondary | ICD-10-CM | POA: Diagnosis not present

## 2018-04-29 DIAGNOSIS — Z01818 Encounter for other preprocedural examination: Secondary | ICD-10-CM | POA: Diagnosis not present

## 2018-04-29 DIAGNOSIS — Z951 Presence of aortocoronary bypass graft: Secondary | ICD-10-CM | POA: Insufficient documentation

## 2018-04-29 DIAGNOSIS — I2510 Atherosclerotic heart disease of native coronary artery without angina pectoris: Secondary | ICD-10-CM | POA: Diagnosis not present

## 2018-04-29 DIAGNOSIS — E785 Hyperlipidemia, unspecified: Secondary | ICD-10-CM | POA: Diagnosis not present

## 2018-04-29 DIAGNOSIS — E119 Type 2 diabetes mellitus without complications: Secondary | ICD-10-CM | POA: Insufficient documentation

## 2018-04-29 DIAGNOSIS — M545 Low back pain, unspecified: Secondary | ICD-10-CM

## 2018-04-29 DIAGNOSIS — Z79899 Other long term (current) drug therapy: Secondary | ICD-10-CM | POA: Insufficient documentation

## 2018-04-29 DIAGNOSIS — Z794 Long term (current) use of insulin: Secondary | ICD-10-CM | POA: Insufficient documentation

## 2018-04-29 LAB — CBC WITH DIFFERENTIAL/PLATELET
Abs Immature Granulocytes: 0.69 10*3/uL — ABNORMAL HIGH (ref 0.00–0.07)
Basophils Absolute: 0.1 10*3/uL (ref 0.0–0.1)
Basophils Relative: 1 %
Eosinophils Absolute: 0.1 10*3/uL (ref 0.0–0.5)
Eosinophils Relative: 1 %
HCT: 42.6 % (ref 39.0–52.0)
Hemoglobin: 13.4 g/dL (ref 13.0–17.0)
Immature Granulocytes: 6 %
Lymphocytes Relative: 16 %
Lymphs Abs: 1.8 10*3/uL (ref 0.7–4.0)
MCH: 30.2 pg (ref 26.0–34.0)
MCHC: 31.5 g/dL (ref 30.0–36.0)
MCV: 96.2 fL (ref 80.0–100.0)
Monocytes Absolute: 1.9 10*3/uL — ABNORMAL HIGH (ref 0.1–1.0)
Monocytes Relative: 16 %
Neutro Abs: 7.1 10*3/uL (ref 1.7–7.7)
Neutrophils Relative %: 60 %
Platelets: 212 10*3/uL (ref 150–400)
RBC: 4.43 MIL/uL (ref 4.22–5.81)
RDW: 16.8 % — ABNORMAL HIGH (ref 11.5–15.5)
WBC: 11.6 10*3/uL — ABNORMAL HIGH (ref 4.0–10.5)
nRBC: 0 % (ref 0.0–0.2)

## 2018-04-29 LAB — HEMOGLOBIN A1C
HEMOGLOBIN A1C: 7.3 % — AB (ref 4.8–5.6)
MEAN PLASMA GLUCOSE: 162.81 mg/dL

## 2018-04-29 LAB — COMPREHENSIVE METABOLIC PANEL
ALT: 18 U/L (ref 0–44)
AST: 18 U/L (ref 15–41)
Albumin: 4.5 g/dL (ref 3.5–5.0)
Alkaline Phosphatase: 64 U/L (ref 38–126)
Anion gap: 10 (ref 5–15)
BUN: 10 mg/dL (ref 8–23)
CO2: 24 mmol/L (ref 22–32)
Calcium: 9.7 mg/dL (ref 8.9–10.3)
Chloride: 104 mmol/L (ref 98–111)
Creatinine, Ser: 0.58 mg/dL — ABNORMAL LOW (ref 0.61–1.24)
GFR calc Af Amer: >60 mL/min (ref >60)
GFR calc non Af Amer: >60 mL/min (ref >60)
Glucose, Bld: 110 mg/dL — ABNORMAL HIGH (ref 70–99)
Potassium: 4.7 mmol/L (ref 3.5–5.1)
Sodium: 138 mmol/L (ref 135–145)
Total Bilirubin: 1.1 mg/dL (ref 0.3–1.2)
Total Protein: 7.6 g/dL (ref 6.5–8.1)

## 2018-04-29 LAB — PROTIME-INR
INR: 1 (ref 0.8–1.2)
Prothrombin Time: 12.8 seconds (ref 11.4–15.2)

## 2018-04-29 LAB — GLUCOSE, CAPILLARY: Glucose-Capillary: 120 mg/dL — ABNORMAL HIGH (ref 70–99)

## 2018-04-29 LAB — APTT: aPTT: 30 seconds (ref 24–36)

## 2018-04-29 LAB — SURGICAL PCR SCREEN
MRSA, PCR: NEGATIVE
Staphylococcus aureus: NEGATIVE

## 2018-04-29 NOTE — Progress Notes (Signed)
04-17-18 Cardiac Clearance on chart from Dr. Prince Rome

## 2018-04-30 NOTE — Progress Notes (Signed)
PCP: [facility]  CARDIOLOGIST: Janit Pagan, MD  INFO IN Epic: Current Labs  INFO ON CHART: Cardiac Clearance from Dr. Prince Rome  BLOOD THINNERS AND LAST DOSES: ASA 325, Last dose 03/31/18 ____________________________________  PATIENT SYMPTOMS AT TIME OF PREOP:  Hx of HTN, DM, CAD, CABG, Stents x 3

## 2018-05-03 NOTE — Progress Notes (Addendum)
Anesthesia Chart Review   Case:  [id number] Date/Time:  05/05/18 0945   Procedure:  Lumbar decompression L3-L4 and L4-L5 (N/A ) - 138min   Anesthesia type:  General   Pre-op diagnosis:  spinal stenosis lumbar L3-4 and L4-5   Location:  [facility] ROOM 08 / [facility]   Surgeon:  Latanya Maudlin, MD      DISCUSSION: 78 yo current every day smoker (45 pack years) with h/o HTN, HLD, GERD, DM II, CAD (CABG stents x3 1993), spinal stenosis L3-4, L4-5 scheduled for above procedure 05/05/18 with Dr. Latanya Maudlin.    Pt last seen by cardiologist, Dr. Valetta Fuller, 09/02/17.  Per note from Dr. Prince Rome, "He should be able to proceed with surgery without undue cardiac risk as long as he is not having cp or sob."  Pt asymptomatic at PAT visit 04/29/18.   Pt can proceed with planned procedure barring acute status change.  VS: BP (!) 148/69   Pulse 63   Temp 36.9 C (Oral)   Ht 5\' 7"  (1.702 m)   Wt 90.4 kg   SpO2 98%   BMI 31.21 kg/m   PROVIDERS: Patient, No Pcp Per  [facility]  Valetta Fuller, MD is cardiologist   Susette Racer, MD is Endocrinologist  LABS: Labs reviewed: Acceptable for surgery. (all labs ordered are listed, but only abnormal results are displayed)  Labs Reviewed  CBC WITH DIFFERENTIAL/PLATELET - Abnormal; Notable for the following components:      Result Value   WBC 11.6 (*)    RDW 16.8 (*)    Monocytes Absolute 1.9 (*)    Abs Immature Granulocytes 0.69 (*)    All other components within normal limits  COMPREHENSIVE METABOLIC PANEL - Abnormal; Notable for the following components:   Glucose, Bld 110 (*)    Creatinine, Ser 0.58 (*)    All other components within normal limits  HEMOGLOBIN A1C - Abnormal; Notable for the following components:   Hgb A1c MFr Bld 7.3 (*)    All other components within normal limits  GLUCOSE, CAPILLARY - Abnormal; Notable for the following components:   Glucose-Capillary 120 (*)    All other components within normal limits  SURGICAL  PCR SCREEN  APTT  PROTIME-INR     IMAGES:   EKG: 04/29/2018 Rate 70 bpm Normal sinus rhythm  Normal ECG  CV: Echo 09/10/17 (on chart) Interpretation Summary A complete two-dimensional transthoracic echocardiogram with color flow Doppler and spectral  Doppler was performed.  The left ventricle is normal in size.  There is mild concentric left ventricular hypertrophy.  The left ventricular ejection fraction is normal (55-60%) The left ventricular wall motion is normal.  Grade I mild diastolic dysfunction; abnormal relaxation pattern.  There is heavy calcification of the aortic valve.  There is mild aortic stenosis (aortic valve area >1.5cm2.) Past Medical History:  Diagnosis Date  . Arthritis    oa  . Cancer ([place])    skin cancer area removed basal cell  . Coronary artery disease   . Diabetes mellitus without complication ([place])   . Gallstones   . GERD (gastroesophageal reflux disease)   . History of kidney stones yrs ago  . Hyperlipidemia   . Hypertension     Past Surgical History:  Procedure Laterality Date  . CARDIAC CATHETERIZATION  last 2009  . CORONARY ANGIOPLASTY  1993   with stents done  . CORONARY ARTERY BYPASS GRAFT  1993   x 3  . EXTRACORPOREAL SHOCK WAVE LITHOTRIPSY  4-5- yrs ago  . right rotator cuff repair  2008  . stent to heart   2009   rca  . TOTAL KNEE ARTHROPLASTY Right 11/28/2015   Procedure: RIGHT TOTAL KNEE ARTHROPLASTY;  Surgeon: Latanya Maudlin, MD;  Location: [facility];  Service: Orthopedics;  Laterality: Right;    MEDICATIONS: . aspirin 325 MG tablet  . atorvastatin (LIPITOR) 40 MG tablet  . Cholecalciferol (VITAMIN D) 2000 units CAPS  . furosemide (LASIX) 40 MG tablet  . Garlic 1093 MG CAPS  . HUMALOG KWIKPEN 100 UNIT/ML KiwkPen  . Insulin Detemir (LEVEMIR FLEXPEN) 100 UNIT/ML Pen  . lisinopril (PRINIVIL,ZESTRIL) 40 MG tablet  . metFORMIN (GLUCOPHAGE) 1000 MG tablet  . metoprolol tartrate (LOPRESSOR) 25 MG tablet  . Multiple  Vitamins-Minerals (MULTIVITAMIN PO)  . omeprazole (PRILOSEC) 40 MG capsule   No current facility-administered medications for this encounter.    . bupivacaine liposome (EXPAREL) 1.3 % injection 266 mg    Maia Plan [facility] Pre-Surgical Testing [phone] 05/03/18 12:23 PM

## 2018-05-04 MED ORDER — BUPIVACAINE LIPOSOME 1.3 % IJ SUSP
20.0000 mL | INTRAMUSCULAR | Status: AC
  Filled 2018-05-04: qty 20

## 2018-05-04 NOTE — Anesthesia Preprocedure Evaluation (Addendum)
Anesthesia Evaluation  Patient identified by MRN, date of birth, ID band Patient awake    Reviewed: Allergy & Precautions, NPO status , Patient's Chart, lab work & pertinent test results, reviewed documented beta blocker date and time   Airway Mallampati: II  TM Distance: <3 FB Neck ROM: Full    Dental  (+) Dental Advisory Given, Edentulous Upper, Edentulous Lower   Pulmonary Current Smoker,    Pulmonary exam normal breath sounds clear to auscultation       Cardiovascular hypertension, Pt. on home beta blockers + angina + CAD, + Cardiac Stents and + CABG   Rhythm:Regular Rate:Normal + Systolic murmurs Echo 20/10/07 (on chart) Interpretation Summary A complete two-dimensional transthoracic echocardiogram with color flow Doppler and spectral  Doppler was performed.  The left ventricle is normal in size.  There is mild concentric left ventricular hypertrophy.  The left ventricular ejection fraction is normal (55-60%) The left ventricular wall motion is normal.  Grade I mild diastolic dysfunction; abnormal relaxation pattern.  There is heavy calcification of the aortic valve.  There is mild aortic stenosis (aortic valve area >1.5cm2.)   Neuro/Psych  Neuromuscular disease negative psych ROS   GI/Hepatic Neg liver ROS, GERD  Medicated and Controlled,  Endo/Other  diabetes, Type 2, Insulin Dependent, Oral Hypoglycemic AgentsObesity   Renal/GU negative Renal ROS     Musculoskeletal  (+) Arthritis , Osteoarthritis,    Abdominal   Peds  Hematology negative hematology ROS (+)   Anesthesia Other Findings Day of surgery medications reviewed with the patient.  Reproductive/Obstetrics                            Anesthesia Physical Anesthesia Plan  ASA: III  Anesthesia Plan: General   Post-op Pain Management:    Induction: Intravenous  PONV Risk Score and Plan: 2 and Dexamethasone and  Ondansetron  Airway Management Planned: Oral ETT  Additional Equipment:   Intra-op Plan:   Post-operative Plan: Extubation in OR  Informed Consent: I have reviewed the patients History and Physical, chart, labs and discussed the procedure including the risks, benefits and alternatives for the proposed anesthesia with the patient or authorized representative who has indicated his/her understanding and acceptance.     Dental advisory given  Plan Discussed with: CRNA  Anesthesia Plan Comments: (See PAT note 04/29/18, Konrad Felix, PA-C)       Anesthesia Quick Evaluation

## 2018-05-05 ENCOUNTER — Encounter (HOSPITAL_COMMUNITY): Payer: Self-pay | Admitting: Emergency Medicine

## 2018-05-05 ENCOUNTER — Observation Stay (HOSPITAL_COMMUNITY)
Admission: RE | Admit: 2018-05-05 | Discharge: 2018-05-06 | Disposition: A | Discharge status: Home / Self Care | Payer: Medicare Other | Source: Ambulatory Visit | Attending: Orthopedic Surgery | Admitting: Orthopedic Surgery

## 2018-05-05 ENCOUNTER — Ambulatory Visit (HOSPITAL_COMMUNITY): Payer: Medicare Other | Admitting: Physician Assistant

## 2018-05-05 ENCOUNTER — Ambulatory Visit (HOSPITAL_COMMUNITY): Payer: Medicare Other | Admitting: Anesthesiology

## 2018-05-05 ENCOUNTER — Ambulatory Visit (HOSPITAL_COMMUNITY): Payer: Medicare Other

## 2018-05-05 ENCOUNTER — Other Ambulatory Visit: Payer: Self-pay

## 2018-05-05 ENCOUNTER — Encounter (HOSPITAL_COMMUNITY)
Admission: RE | Disposition: A | Discharge status: Home / Self Care | Payer: Self-pay | Source: Ambulatory Visit | Attending: Orthopedic Surgery

## 2018-05-05 DIAGNOSIS — Z96651 Presence of right artificial knee joint: Secondary | ICD-10-CM | POA: Diagnosis not present

## 2018-05-05 DIAGNOSIS — Z7982 Long term (current) use of aspirin: Secondary | ICD-10-CM | POA: Diagnosis not present

## 2018-05-05 DIAGNOSIS — Z79899 Other long term (current) drug therapy: Secondary | ICD-10-CM | POA: Diagnosis not present

## 2018-05-05 DIAGNOSIS — E785 Hyperlipidemia, unspecified: Secondary | ICD-10-CM | POA: Insufficient documentation

## 2018-05-05 DIAGNOSIS — E669 Obesity, unspecified: Secondary | ICD-10-CM | POA: Insufficient documentation

## 2018-05-05 DIAGNOSIS — I10 Essential (primary) hypertension: Secondary | ICD-10-CM | POA: Insufficient documentation

## 2018-05-05 DIAGNOSIS — Z955 Presence of coronary angioplasty implant and graft: Secondary | ICD-10-CM | POA: Insufficient documentation

## 2018-05-05 DIAGNOSIS — Z951 Presence of aortocoronary bypass graft: Secondary | ICD-10-CM | POA: Diagnosis not present

## 2018-05-05 DIAGNOSIS — E119 Type 2 diabetes mellitus without complications: Secondary | ICD-10-CM | POA: Insufficient documentation

## 2018-05-05 DIAGNOSIS — I2510 Atherosclerotic heart disease of native coronary artery without angina pectoris: Secondary | ICD-10-CM | POA: Insufficient documentation

## 2018-05-05 DIAGNOSIS — Z85828 Personal history of other malignant neoplasm of skin: Secondary | ICD-10-CM | POA: Insufficient documentation

## 2018-05-05 DIAGNOSIS — Z794 Long term (current) use of insulin: Secondary | ICD-10-CM | POA: Insufficient documentation

## 2018-05-05 DIAGNOSIS — Z6831 Body mass index (BMI) 31.0-31.9, adult: Secondary | ICD-10-CM | POA: Insufficient documentation

## 2018-05-05 DIAGNOSIS — M48062 Spinal stenosis, lumbar region with neurogenic claudication: Secondary | ICD-10-CM | POA: Diagnosis not present

## 2018-05-05 DIAGNOSIS — F17210 Nicotine dependence, cigarettes, uncomplicated: Secondary | ICD-10-CM | POA: Insufficient documentation

## 2018-05-05 DIAGNOSIS — M545 Low back pain: Secondary | ICD-10-CM | POA: Diagnosis present

## 2018-05-05 DIAGNOSIS — K219 Gastro-esophageal reflux disease without esophagitis: Secondary | ICD-10-CM | POA: Diagnosis not present

## 2018-05-05 DIAGNOSIS — Z419 Encounter for procedure for purposes other than remedying health state, unspecified: Secondary | ICD-10-CM

## 2018-05-05 HISTORY — PX: LUMBAR LAMINECTOMY/DECOMPRESSION MICRODISCECTOMY: SHX5026

## 2018-05-05 LAB — GLUCOSE, CAPILLARY
Glucose-Capillary: 242 mg/dL — ABNORMAL HIGH (ref 70–99)
Glucose-Capillary: 171 mg/dL — ABNORMAL HIGH (ref 70–99)
Glucose-Capillary: 174 mg/dL — ABNORMAL HIGH (ref 70–99)
Glucose-Capillary: 254 mg/dL — ABNORMAL HIGH (ref 70–99)
Glucose-Capillary: 285 mg/dL — ABNORMAL HIGH (ref 70–99)

## 2018-05-05 SURGERY — LUMBAR LAMINECTOMY/DECOMPRESSION MICRODISCECTOMY
Anesthesia: General

## 2018-05-05 MED ORDER — FUROSEMIDE 40 MG PO TABS: 40.0000 mg | ORAL_TABLET | Freq: Every day | ORAL | Status: DC | PRN

## 2018-05-05 MED ORDER — FENTANYL CITRATE (PF) 100 MCG/2ML IJ SOLN
INTRAMUSCULAR | Status: AC
  Filled 2018-05-05: qty 2

## 2018-05-05 MED ORDER — METHOCARBAMOL 500 MG IVPB - SIMPLE MED
500.0000 mg | Freq: Four times a day (QID) | INTRAVENOUS | Status: DC | PRN
  Administered 2018-05-05: 500 mg via INTRAVENOUS
  Filled 2018-05-05: qty 50

## 2018-05-05 MED ORDER — INSULIN ASPART 100 UNIT/ML ~~LOC~~ SOLN
4.0000 [IU] | Freq: Once | SUBCUTANEOUS | Status: AC
  Administered 2018-05-05: 4 [IU] via INTRAVENOUS

## 2018-05-05 MED ORDER — FENTANYL CITRATE (PF) 100 MCG/2ML IJ SOLN
INTRAMUSCULAR | Status: DC | PRN
  Administered 2018-05-05 (×4): 50 ug via INTRAVENOUS

## 2018-05-05 MED ORDER — CEFAZOLIN SODIUM-DEXTROSE 2-4 GM/100ML-% IV SOLN
2.0000 g | INTRAVENOUS | Status: AC
  Administered 2018-05-05: 2 g via INTRAVENOUS
  Filled 2018-05-05: qty 100

## 2018-05-05 MED ORDER — LIDOCAINE 2% (20 MG/ML) 5 ML SYRINGE
INTRAMUSCULAR | Status: DC | PRN
  Administered 2018-05-05: 100 mg via INTRAVENOUS

## 2018-05-05 MED ORDER — BUPIVACAINE LIPOSOME 1.3 % IJ SUSP
INTRAMUSCULAR | Status: DC | PRN
  Administered 2018-05-05: 20 mL

## 2018-05-05 MED ORDER — ONDANSETRON HCL 4 MG/2ML IJ SOLN
INTRAMUSCULAR | Status: DC | PRN
  Administered 2018-05-05: 4 mg via INTRAVENOUS

## 2018-05-05 MED ORDER — HYDROCODONE-ACETAMINOPHEN 10-325 MG PO TABS
2.0000 | ORAL_TABLET | ORAL | Status: DC | PRN
  Administered 2018-05-05 (×2): 2 via ORAL
  Administered 2018-05-06: 1 via ORAL
  Filled 2018-05-05 (×3): qty 2

## 2018-05-05 MED ORDER — FLEET ENEMA 7-19 GM/118ML RE ENEM: 1.0000 | ENEMA | Freq: Once | RECTAL | Status: DC | PRN

## 2018-05-05 MED ORDER — METHOCARBAMOL 500 MG IVPB - SIMPLE MED
INTRAVENOUS | Status: AC
  Filled 2018-05-05: qty 50

## 2018-05-05 MED ORDER — LACTATED RINGERS IV SOLN
INTRAVENOUS | Status: DC
  Administered 2018-05-05 – 2018-05-06 (×2): via INTRAVENOUS

## 2018-05-05 MED ORDER — BACITRACIN ZINC 500 UNIT/GM EX OINT
TOPICAL_OINTMENT | CUTANEOUS | Status: AC
  Filled 2018-05-05: qty 28.35

## 2018-05-05 MED ORDER — BACITRACIN-NEOMYCIN-POLYMYXIN 400-5-5000 EX OINT
TOPICAL_OINTMENT | CUTANEOUS | Status: DC | PRN
  Administered 2018-05-05: 1 via TOPICAL

## 2018-05-05 MED ORDER — ONDANSETRON HCL 4 MG/2ML IJ SOLN: 4.0000 mg | Freq: Four times a day (QID) | INTRAMUSCULAR | Status: DC | PRN

## 2018-05-05 MED ORDER — PROPOFOL 10 MG/ML IV BOLUS
INTRAVENOUS | Status: DC | PRN
  Administered 2018-05-05: 180 mg via INTRAVENOUS

## 2018-05-05 MED ORDER — DEXAMETHASONE SODIUM PHOSPHATE 10 MG/ML IJ SOLN
INTRAMUSCULAR | Status: AC
  Filled 2018-05-05: qty 1

## 2018-05-05 MED ORDER — PHENOL 1.4 % MT LIQD
1.0000 | OROMUCOSAL | Status: DC | PRN
  Filled 2018-05-05: qty 177

## 2018-05-05 MED ORDER — THROMBIN (RECOMBINANT) 5000 UNITS EX SOLR
CUTANEOUS | Status: AC
  Filled 2018-05-05: qty 10000

## 2018-05-05 MED ORDER — BUPIVACAINE-EPINEPHRINE 0.25% -1:200000 IJ SOLN
INTRAMUSCULAR | Status: DC | PRN
  Administered 2018-05-05: 20 mL

## 2018-05-05 MED ORDER — ACETAMINOPHEN 500 MG PO TABS
1000.0000 mg | ORAL_TABLET | Freq: Once | ORAL | Status: AC
  Administered 2018-05-05: 1000 mg via ORAL
  Filled 2018-05-05: qty 2

## 2018-05-05 MED ORDER — PHENYLEPHRINE 40 MCG/ML (10ML) SYRINGE FOR IV PUSH (FOR BLOOD PRESSURE SUPPORT)
PREFILLED_SYRINGE | INTRAVENOUS | Status: DC | PRN
  Administered 2018-05-05 (×2): 80 ug via INTRAVENOUS

## 2018-05-05 MED ORDER — ACETAMINOPHEN 325 MG PO TABS: 650.0000 mg | ORAL_TABLET | ORAL | Status: DC | PRN

## 2018-05-05 MED ORDER — BUPIVACAINE-EPINEPHRINE (PF) 0.25% -1:200000 IJ SOLN
INTRAMUSCULAR | Status: AC
  Filled 2018-05-05: qty 30

## 2018-05-05 MED ORDER — ACETAMINOPHEN 650 MG RE SUPP: 650.0000 mg | RECTAL | Status: DC | PRN

## 2018-05-05 MED ORDER — PANTOPRAZOLE SODIUM 40 MG PO TBEC
40.0000 mg | DELAYED_RELEASE_TABLET | Freq: Every day | ORAL | Status: DC
  Administered 2018-05-06: 40 mg via ORAL
  Filled 2018-05-05: qty 1

## 2018-05-05 MED ORDER — BISACODYL 5 MG PO TBEC: 5.0000 mg | DELAYED_RELEASE_TABLET | Freq: Every day | ORAL | Status: DC | PRN

## 2018-05-05 MED ORDER — INSULIN ASPART 100 UNIT/ML ~~LOC~~ SOLN
0.0000 [IU] | Freq: Three times a day (TID) | SUBCUTANEOUS | Status: DC
  Administered 2018-05-05: 5 [IU] via SUBCUTANEOUS
  Administered 2018-05-06: 2 [IU] via SUBCUTANEOUS

## 2018-05-05 MED ORDER — HYDROCODONE-ACETAMINOPHEN 5-325 MG PO TABS
1.0000 | ORAL_TABLET | ORAL | Status: DC | PRN
  Administered 2018-05-06: 1 via ORAL
  Filled 2018-05-05: qty 1

## 2018-05-05 MED ORDER — POLYETHYLENE GLYCOL 3350 17 G PO PACK: 17.0000 g | PACK | Freq: Every day | ORAL | Status: DC | PRN

## 2018-05-05 MED ORDER — METHOCARBAMOL 500 MG PO TABS
500.0000 mg | ORAL_TABLET | Freq: Four times a day (QID) | ORAL | Status: DC | PRN
  Administered 2018-05-05: 500 mg via ORAL
  Filled 2018-05-05: qty 1

## 2018-05-05 MED ORDER — SODIUM CHLORIDE 0.9 % IV SOLN
INTRAVENOUS | Status: DC | PRN
  Administered 2018-05-05: 10 ug/min via INTRAVENOUS

## 2018-05-05 MED ORDER — CHLORHEXIDINE GLUCONATE 4 % EX LIQD: 60.0000 mL | Freq: Once | CUTANEOUS | Status: DC

## 2018-05-05 MED ORDER — CEFAZOLIN SODIUM-DEXTROSE 1-4 GM/50ML-% IV SOLN
1.0000 g | Freq: Three times a day (TID) | INTRAVENOUS | Status: DC
  Administered 2018-05-05 – 2018-05-06 (×2): 1 g via INTRAVENOUS
  Filled 2018-05-05 (×2): qty 50

## 2018-05-05 MED ORDER — THROMBIN 5000 UNITS EX SOLR
CUTANEOUS | Status: DC | PRN
  Administered 2018-05-05: 10000 [IU] via TOPICAL

## 2018-05-05 MED ORDER — HYDROMORPHONE HCL 1 MG/ML IJ SOLN: 0.5000 mg | INTRAMUSCULAR | Status: DC | PRN

## 2018-05-05 MED ORDER — DEXAMETHASONE SODIUM PHOSPHATE 10 MG/ML IJ SOLN
INTRAMUSCULAR | Status: DC | PRN
  Administered 2018-05-05: 5 mg via INTRAVENOUS

## 2018-05-05 MED ORDER — LIDOCAINE 2% (20 MG/ML) 5 ML SYRINGE
INTRAMUSCULAR | Status: AC
  Filled 2018-05-05: qty 5

## 2018-05-05 MED ORDER — ROCURONIUM BROMIDE 100 MG/10ML IV SOLN
INTRAVENOUS | Status: AC
  Filled 2018-05-05: qty 1

## 2018-05-05 MED ORDER — SUGAMMADEX SODIUM 200 MG/2ML IV SOLN
INTRAVENOUS | Status: AC
  Filled 2018-05-05: qty 2

## 2018-05-05 MED ORDER — PHENYLEPHRINE HCL 10 MG/ML IJ SOLN
INTRAMUSCULAR | Status: AC
  Filled 2018-05-05: qty 1

## 2018-05-05 MED ORDER — SUGAMMADEX SODIUM 200 MG/2ML IV SOLN
INTRAVENOUS | Status: DC | PRN
  Administered 2018-05-05: 200 mg via INTRAVENOUS

## 2018-05-05 MED ORDER — METOPROLOL TARTRATE 25 MG PO TABS
25.0000 mg | ORAL_TABLET | Freq: Two times a day (BID) | ORAL | Status: DC
  Administered 2018-05-05 – 2018-05-06 (×2): 25 mg via ORAL
  Filled 2018-05-05 (×2): qty 1

## 2018-05-05 MED ORDER — ONDANSETRON HCL 4 MG/2ML IJ SOLN: 4.0000 mg | Freq: Once | INTRAMUSCULAR | Status: DC | PRN

## 2018-05-05 MED ORDER — MENTHOL 3 MG MT LOZG: 1.0000 | LOZENGE | OROMUCOSAL | Status: DC | PRN

## 2018-05-05 MED ORDER — SODIUM CHLORIDE 0.9 % IV SOLN
INTRAVENOUS | Status: DC | PRN
  Administered 2018-05-05: 500 mL

## 2018-05-05 MED ORDER — SODIUM CHLORIDE 0.9 % IV SOLN
INTRAVENOUS | Status: AC
  Filled 2018-05-05: qty 500000

## 2018-05-05 MED ORDER — FENTANYL CITRATE (PF) 100 MCG/2ML IJ SOLN
25.0000 ug | INTRAMUSCULAR | Status: DC | PRN
  Administered 2018-05-05: 50 ug via INTRAVENOUS

## 2018-05-05 MED ORDER — LISINOPRIL 20 MG PO TABS
40.0000 mg | ORAL_TABLET | Freq: Every day | ORAL | Status: DC
  Administered 2018-05-06: 40 mg via ORAL
  Filled 2018-05-05: qty 2

## 2018-05-05 MED ORDER — ONDANSETRON HCL 4 MG PO TABS: 4.0000 mg | ORAL_TABLET | Freq: Four times a day (QID) | ORAL | Status: DC | PRN

## 2018-05-05 MED ORDER — ONDANSETRON HCL 4 MG/2ML IJ SOLN
INTRAMUSCULAR | Status: AC
  Filled 2018-05-05: qty 2

## 2018-05-05 MED ORDER — ROCURONIUM BROMIDE 10 MG/ML (PF) SYRINGE
PREFILLED_SYRINGE | INTRAVENOUS | Status: DC | PRN
  Administered 2018-05-05: 50 mg via INTRAVENOUS
  Administered 2018-05-05: 10 mg via INTRAVENOUS

## 2018-05-05 MED ORDER — PROPOFOL 10 MG/ML IV BOLUS
INTRAVENOUS | Status: AC
  Filled 2018-05-05: qty 20

## 2018-05-05 MED ORDER — LACTATED RINGERS IV SOLN
INTRAVENOUS | Status: DC
  Administered 2018-05-05 (×2): via INTRAVENOUS

## 2018-05-05 SURGICAL SUPPLY — 45 items
BAG ZIPLOCK 12X15 (MISCELLANEOUS) IMPLANT
BENZOIN TINCTURE PRP APPL 2/3 (GAUZE/BANDAGES/DRESSINGS) ×3 IMPLANT
CHLORAPREP W/TINT 26 (MISCELLANEOUS) ×3 IMPLANT
CLEANER TIP ELECTROSURG 2X2 (MISCELLANEOUS) ×3 IMPLANT
COVER SURGICAL LIGHT HANDLE (MISCELLANEOUS) ×3 IMPLANT
COVER WAND RF STERILE (DRAPES) IMPLANT
DRAPE MICROSCOPE LEICA (MISCELLANEOUS) ×3 IMPLANT
DRAPE SHEET LG 3/4 BI-LAMINATE (DRAPES) ×3 IMPLANT
DRAPE SURG 17X11 SM STRL (DRAPES) ×3 IMPLANT
DRSG ADAPTIC 3X8 NADH LF (GAUZE/BANDAGES/DRESSINGS) ×3 IMPLANT
DRSG PAD ABDOMINAL 8X10 ST (GAUZE/BANDAGES/DRESSINGS) ×12 IMPLANT
ELECT BLADE TIP CTD 4 INCH (ELECTRODE) ×3 IMPLANT
ELECT REM PT RETURN 15FT ADLT (MISCELLANEOUS) ×3 IMPLANT
GAUZE SPONGE 4X4 12PLY STRL (GAUZE/BANDAGES/DRESSINGS) ×3 IMPLANT
GLOVE BIOGEL PI IND STRL 6.5 (GLOVE) ×1 IMPLANT
GLOVE BIOGEL PI IND STRL 8.5 (GLOVE) ×1 IMPLANT
GLOVE BIOGEL PI INDICATOR 6.5 (GLOVE) ×2
GLOVE BIOGEL PI INDICATOR 8.5 (GLOVE) ×2
GLOVE ECLIPSE 8.0 STRL XLNG CF (GLOVE) ×6 IMPLANT
GLOVE SURG SS PI 6.5 STRL IVOR (GLOVE) ×3 IMPLANT
GOWN STRL REUS W/ TWL LRG LVL3 (GOWN DISPOSABLE) ×1 IMPLANT
GOWN STRL REUS W/TWL LRG LVL3 (GOWN DISPOSABLE) ×2
GOWN STRL REUS W/TWL XL LVL3 (GOWN DISPOSABLE) ×3 IMPLANT
HEMOSTAT SPONGE AVITENE ULTRA (HEMOSTASIS) ×3 IMPLANT
KIT BASIN OR (CUSTOM PROCEDURE TRAY) ×3 IMPLANT
KIT TURNOVER KIT A (KITS) IMPLANT
MANIFOLD NEPTUNE II (INSTRUMENTS) ×3 IMPLANT
NDL SPNL 18GX3.5 QUINCKE PK (NEEDLE) ×2 IMPLANT
NEEDLE HYPO 22GX1.5 SAFETY (NEEDLE) ×3 IMPLANT
NEEDLE SPNL 18GX3.5 QUINCKE PK (NEEDLE) ×6 IMPLANT
PACK LAMINECTOMY ORTHO (CUSTOM PROCEDURE TRAY) ×3 IMPLANT
PAD ABD 7.5X8 STRL (GAUZE/BANDAGES/DRESSINGS) ×2 IMPLANT
PATTIES SURGICAL .5 X.5 (GAUZE/BANDAGES/DRESSINGS) IMPLANT
PATTIES SURGICAL .75X.75 (GAUZE/BANDAGES/DRESSINGS) ×3 IMPLANT
PATTIES SURGICAL 1X1 (DISPOSABLE) IMPLANT
SPONGE LAP 4X18 RFD (DISPOSABLE) ×6 IMPLANT
STAPLER VISISTAT 35W (STAPLE) ×3 IMPLANT
SUT VIC AB 1 CT1 27 (SUTURE) ×4
SUT VIC AB 1 CT1 27XBRD ANTBC (SUTURE) ×2 IMPLANT
SUT VIC AB 2-0 CT1 27 (SUTURE) ×4
SUT VIC AB 2-0 CT1 TAPERPNT 27 (SUTURE) ×2 IMPLANT
SYR 20CC LL (SYRINGE) ×3 IMPLANT
TAPE CLOTH SURG 6X10 WHT LF (GAUZE/BANDAGES/DRESSINGS) ×2 IMPLANT
TOWEL OR 17X26 10 PK STRL BLUE (TOWEL DISPOSABLE) ×3 IMPLANT
TOWEL OR NON WOVEN STRL DISP B (DISPOSABLE) ×3 IMPLANT

## 2018-05-05 NOTE — Op Note (Signed)
NAME: Carl Sutton, Carl Sutton MEDICAL RECORD QI:[id number] ACCOUNT [id number] DATE OF BIRTH:[birth year] FACILITY: [facility] LOCATION: [facility] PHYSICIAN:Rheanna Sergent Fransico Setters, MD  OPERATIVE REPORT  DATE OF PROCEDURE:  05/05/2018  SURGEON:  Latanya Maudlin, MD  ASSISTANT:  Ardeen Jourdain PA.  PREOPERATIVE DIAGNOSES: 1.  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 2.  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 3.  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  POSTOPERATIVE DIAGNOSES: 1.  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   2.  Severe spinal stenosis at L4-L5. 3.  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  OPERATION: 1.  Complete decompressive lumbar laminectomy at L3-L4 for a complete block secondary to spinal stenosis. 2.  Complete decompressive lumbar laminectomy at L4-L5 for complete block involving spinal stenosis. 3.  Foraminotomies for the L4 and the L5 nerve roots bilaterally. 4.  Foraminal stenosis.  DESCRIPTION OF PROCEDURE:  Under general anesthesia with the patient on a Wilson frame, the patient first had a Foley catheter inserted.  At this time, he had 2 grams of IV Ancef.  The appropriate time-out was carried out.  At this particular time, I did  not need to mark the back because we were going central and bilaterally.  Two needles were placed in the back for localization purposes.  An x-ray was taken to verify the position.  Following this, we then made an incision over the L3-L4, L4-L5 region  and extended proximally and distally.  Prior to the incision, I did inject 20 mL of 0.25% Marcaine with epinephrine and soft tissue to control bleeding.  The muscle then was stripped from the lamina and spinous processes bilaterally.  The self-retaining  McCullough retractors were inserted after an x-ray was taken to verify the position.  We then began our dissection.  We  removed the spinous process of L3 and L4.  We went down and did a complete central decompressive lumbar laminectomy at L3-L4, L4-L5  for 2 complete blocks.  The microscope was brought in.  Great care was taken to protect the dura at all time with cottonoids and with the hockey stick.  We then went out laterally and decompressed the lateral recesses.  We went up proximally and distally  for the decompression.  Another x-ray was taken with the instruments in place.  Note, we went above the L3-L4 space with our decompression until we had a wide open space.  We then went distally.  We went beyond 4-5 region until we were completely free.   These areas were checked with the hockey stick and there was no further compression.  We did go out laterally as I mentioned and decompressed the recesses as well.  We thoroughly irrigated out the area and loosely applied some thrombin-soaked Gelfoam.   I closed the wound in layers in the usual fashion except a small distal deep and proximal part of the wound was left open for drainage purposes.  At the end of the procedure, we injected 20 mL of Exparel into the soft tissue.  Skin was closed with metal  staples.  Sterile dressings were applied.    The patient will be admitted overnight.  AN/NUANCE  D:05/05/2018 T:05/05/2018 JOB:[id number]

## 2018-05-05 NOTE — Transfer of Care (Signed)
Immediate Anesthesia Transfer of Care Note  Patient: Carl Sutton  Procedure(s) Performed: Lumbar decompression L3-L4 and L4-L5 for Spinal Stenosis and a Complete Block  and Bilateral Foraminotomies at Two Levels,for the L-4. and L-5 Nerve Roots for Foraminal Stenosis. (N/A )  Patient Location: PACU  Anesthesia Type:General  Level of Consciousness: awake, alert , oriented and patient cooperative  Airway & Oxygen Therapy: Patient Spontanous Breathing and Patient connected to face mask  Post-op Assessment: Report given to RN and Post -op Vital signs reviewed and stable  Post vital signs: Reviewed and stable  Last Vitals:  Vitals Value Taken Time  BP 156/78 05/05/2018  4:26 PM  Temp 36.5 C 05/05/2018  4:26 PM  Pulse 79 05/05/2018  4:26 PM  Resp 16 05/05/2018  4:26 PM  SpO2 100 % 05/05/2018  4:26 PM    Last Pain:  Vitals:   05/05/18 1626  TempSrc: Oral  PainSc:       Patients Stated Pain Goal: 4 (72/53/66 4403)  Complications: No apparent anesthesia complications

## 2018-05-05 NOTE — Brief Op Note (Signed)
05/05/2018  12:11 PM  PATIENT:  Carl Sutton  78 y.o. male  PRE-OPERATIVE DIAGNOSIS:  spinal stenosis lumbar L3-4 and L4-5 and Bilateral Foraminal Stenosis involving the L-4 and L-5 Nerve Roots.  POST-OPERATIVE DIAGNOSIS:  Same as Carl Ora PA  PROCEDURE:  Procedure(s) with comments: Lumbar decompression L3-L4 and L4-L5 for Spinal Stenosis and a Complete Block  and Bilateral Foraminotomies at Two Levels,for the L-4. and L-5 Nerve Roots for Foraminal Stenosis. (N/A) - 170min  SURGEON:  Surgeon(s) and Role:    Latanya Maudlin, MD - Primary  PHYSICIAN ASSISTANT: Ardeen Jourdain PA  ASSISTANTS:Amber [place] PA   ANESTHESIA:   general  EBL:  50 mL   BLOOD ADMINISTERED:none  DRAINS: none   LOCAL MEDICATIONS USED: Exparel 20cc.  SPECIMEN:  No Specimen  DISPOSITION OF SPECIMEN:  N/A  COUNTS:  YES  TOURNIQUET:  * No tourniquets in log *  DICTATION: .Other Dictation: Dictation Number [id number]  PLAN OF CARE: Admit for overnight observation  PATIENT DISPOSITION:  PACU - hemodynamically stable.   Delay start of Pharmacological VTE agent (>24hrs) due to surgical blood loss or risk of bleeding: yes

## 2018-05-05 NOTE — Anesthesia Postprocedure Evaluation (Signed)
Anesthesia Post Note  Patient: Carl Sutton  Procedure(s) Performed: Lumbar decompression L3-L4 and L4-L5 for Spinal Stenosis and a Complete Block  and Bilateral Foraminotomies at Two Levels,for the L-4. and L-5 Nerve Roots for Foraminal Stenosis. (N/A )     Patient location during evaluation: PACU Anesthesia Type: General Level of consciousness: awake and alert, awake and oriented Pain management: pain level controlled Vital Signs Assessment: post-procedure vital signs reviewed and stable Respiratory status: spontaneous breathing, nonlabored ventilation and respiratory function stable Cardiovascular status: blood pressure returned to baseline and stable Postop Assessment: no apparent nausea or vomiting Anesthetic complications: no    Last Vitals:  Vitals:   05/05/18 1526 05/05/18 1626  BP: (!) 151/71 (!) 156/78  Pulse: 71 79  Resp: 15 16  Temp: 36.7 C 36.5 C  SpO2: 100% 100%    Last Pain:  Vitals:   05/05/18 1626  TempSrc: Oral  PainSc:                  [name]

## 2018-05-05 NOTE — Anesthesia Procedure Notes (Signed)
Procedure Name: Intubation Date/Time: 05/05/2018 10:26 AM Performed by: Lind Covert, CRNA Pre-anesthesia Checklist: Patient identified, Emergency Drugs available, Suction available, Patient being monitored and Timeout performed Patient Re-evaluated:Patient Re-evaluated prior to induction Oxygen Delivery Method: Circle system utilized Preoxygenation: Pre-oxygenation with 100% oxygen Induction Type: IV induction Ventilation: Mask ventilation without difficulty and Oral airway inserted - appropriate to patient size Laryngoscope Size: Mac and 4 Grade View: Grade I Tube type: Oral Tube size: 7.5 mm Number of attempts: 1 Airway Equipment and Method: Stylet Placement Confirmation: ETT inserted through vocal cords under direct vision,  positive ETCO2 and breath sounds checked- equal and bilateral Secured at: 23 cm Tube secured with: Tape Dental Injury: Teeth and Oropharynx as per pre-operative assessment

## 2018-05-05 NOTE — Discharge Instructions (Signed)
For the first three days, remove your dressing, and tape a piece of saran wrap over your incision. Take your shower, then remove the saran wrap and put a clean dressing on. After three days you can shower without the saran wrap.  No driving while taking pain medications No lifting or excessive bending.  Call Dr. Gladstone Lighter if any wound complications or temperature of 101 degrees F or over.  Call the office for an appointment to see Dr. Gladstone Lighter in two weeks: [phone] and ask for Dr. Charlestine Night medical assistant, Brunilda Payor.

## 2018-05-06 ENCOUNTER — Encounter (HOSPITAL_COMMUNITY): Payer: Self-pay | Admitting: Orthopedic Surgery

## 2018-05-06 DIAGNOSIS — M48062 Spinal stenosis, lumbar region with neurogenic claudication: Secondary | ICD-10-CM | POA: Diagnosis not present

## 2018-05-06 LAB — GLUCOSE, CAPILLARY
Glucose-Capillary: 193 mg/dL — ABNORMAL HIGH (ref 70–99)
Glucose-Capillary: 223 mg/dL — ABNORMAL HIGH (ref 70–99)

## 2018-05-06 MED ORDER — METHOCARBAMOL 500 MG PO TABS: 500.0000 mg | ORAL_TABLET | Freq: Four times a day (QID) | ORAL | 0 refills | Status: AC | PRN

## 2018-05-06 MED ORDER — HYDROCODONE-ACETAMINOPHEN 5-325 MG PO TABS: 1.0000 | ORAL_TABLET | ORAL | 0 refills | Status: AC | PRN

## 2018-05-06 NOTE — Evaluation (Signed)
Occupational Therapy Evaluation Patient Details Name: Carl Sutton MRN: [id number] DOB: September 05, 1940 Today's Date: 05/06/2018    History of Present Illness  s/p Complete decompressive lumbar laminectomy at L3-L4, L4-5 for a complete block secondary to spinal stenosis.   Clinical Impression   OT education complete regarding back precautions and ADL activity     Follow Up Recommendations  No OT follow up;Supervision/Assistance - 24 hour    Equipment Recommendations  None recommended by OT    Recommendations for Other Services       Precautions / Restrictions Precautions Precautions: Fall;Back Precaution Booklet Issued: Yes (comment) Precaution Comments: handout reviewed  Restrictions Weight Bearing Restrictions: No      Mobility Bed Mobility Overal bed mobility: Needs Assistance Bed Mobility: Rolling;Sidelying to Sit Rolling: Min assist Sidelying to sit: Min assist       General bed mobility comments: pt in chair  Transfers Overall transfer level: Needs assistance Equipment used: Rolling walker (2 wheeled) Transfers: Sit to/from [facility] Sit to Stand: Supervision Stand pivot transfers: Supervision       General transfer comment: cues for hand placement and use of LEs to power up    Balance Overall balance assessment: Needs assistance   Sitting balance-Leahy Scale: Fair       Standing balance-Leahy Scale: Poor Standing balance comment: reliant on UEs                           ADL either performed or assessed with clinical judgement   ADL Overall ADL's : Needs assistance/impaired Eating/Feeding: Independent;Sitting   Grooming: Set up;Standing   Upper Body Bathing: Set up;Sitting   Lower Body Bathing: Minimal assistance;Sit to/from stand;Cueing for safety;Cueing for back precautions;With adaptive equipment;Cueing for compensatory techniques;Adhering to back precautions   Upper Body Dressing : Set up;Sitting    Lower Body Dressing: Minimal assistance;Sit to/from stand;Cueing for sequencing;Cueing for safety;With adaptive equipment;Cueing for compensatory techniques;Cueing for back precautions   Toilet Transfer: Supervision/safety;Comfort height toilet;Ambulation;RW   Toileting- Clothing Manipulation and Hygiene: Supervision/safety;Sit to/from stand;Cueing for sequencing;Cueing for safety       Functional mobility during ADLs: Supervision/safety;Rolling walker General ADL Comments: GF will A as needed     Vision Patient Visual Report: No change from baseline              Pertinent Vitals/Pain Pain Assessment: 0-10 Pain Score: 3  Pain Location: back with transitions Pain Descriptors / Indicators: Grimacing;Sore Pain Intervention(s): Limited activity within patient's tolerance     Hand Dominance     Extremity/Trunk Assessment Upper Extremity Assessment Upper Extremity Assessment: Generalized weakness   Lower Extremity Assessment Lower Extremity Assessment: Overall WFL for tasks assessed       Communication Communication Communication: No difficulties   Cognition Arousal/Alertness: Awake/alert Behavior During Therapy: WFL for tasks assessed/performed Overall Cognitive Status: Within Functional Limits for tasks assessed                                                Home Living Family/patient expects to be discharged to:: Private residence Living Arrangements: Alone Available Help at Discharge: Other (Comment)(going to friend's home at D/C) Type of Home: House Home Access: Stairs to enter [employer] of Steps: 6-8 at friend's home with bil hand rails   Home Layout: One level     Bathroom Shower/Tub:  Walk-in shower   Bathroom Toilet: Standard     Home Equipment: Environmental consultant - 2 wheels;Bedside commode;Cane - single point          Prior Functioning/Environment Level of Independence: Independent                 OT Problem List:  Decreased strength      OT Treatment/Interventions:      OT Goals(Current goals can be found in the care plan section) Acute Rehab OT Goals Patient Stated Goal: home with GF OT Goal Formulation: With patient  OT Frequency:     Barriers to D/C:               AM-PAC OT "6 Clicks" Daily Activity     Outcome Measure Help from another person eating meals?: None Help from another person taking care of personal grooming?: None Help from another person toileting, which includes using toliet, bedpan, or urinal?: A Little Help from another person bathing (including washing, rinsing, drying)?: A Little Help from another person to put on and taking off regular upper body clothing?: None Help from another person to put on and taking off regular lower body clothing?: A Little 6 Click Score: 21   End of Session Equipment Utilized During Treatment: Rolling walker;Gait belt Nurse Communication: Mobility status  Activity Tolerance: Patient tolerated treatment well Patient left: in chair;with call bell/phone within reach;with chair alarm set  OT Visit Diagnosis: Unsteadiness on feet (R26.81)                Time: 8657-8469 OT Time Calculation (min): 29 min Charges:  OT General Charges $OT Visit: 1 Visit OT Evaluation $OT Eval Low Complexity: 1 Low OT Treatments $Self Care/Home Management : 8-22 mins  Kari Baars, OT Acute Rehabilitation Services [phone] Office- [phone]     Kelvis Berger, Edwena Felty D 05/06/2018, 12:37 PM

## 2018-05-06 NOTE — Progress Notes (Signed)
RN reviewed discharge instructions with patient and family. All questions answered.   Paperwork given, and prescriptions sent to pharmacy by MD.   NT rolled patient down with all belongings to family car.

## 2018-05-06 NOTE — Evaluation (Signed)
Physical Therapy Evaluation Patient Details Name: Carl Sutton MRN: [id number] DOB: 11/26/1940 Today's Date: 05/06/2018   History of Present Illness   s/p Complete decompressive lumbar laminectomy at L3-L4, L4-5 for a complete block secondary to spinal stenosis.  Clinical Impression  Patient evaluated by Physical Therapy with no further acute PT needs identified. All education has been completed and the patient has no further questions.  See below for any follow-up Physical Therapy or equipment needs. PT is signing off. Thank you for this referral.     Follow Up Recommendations Follow surgeon's recommendation for DC plan and follow-up therapies    Equipment Recommendations  None recommended by PT    Recommendations for Other Services       Precautions / Restrictions Precautions Precautions: Fall;Back Precaution Booklet Issued: Yes (comment) Precaution Comments: handout reviewed  Restrictions Weight Bearing Restrictions: No      Mobility  Bed Mobility Overal bed mobility: Needs Assistance Bed Mobility: Rolling;Sidelying to Sit Rolling: Min assist Sidelying to sit: Min assist       General bed mobility comments: assist to bend knees, bring LEs off bed and elevate trunk, use of rail  Transfers Overall transfer level: Needs assistance Equipment used: Rolling walker (2 wheeled) Transfers: Sit to/from Stand Sit to Stand: Min guard;Min assist         General transfer comment: cues for hand placement and use of LEs to power up  Ambulation/Gait Ambulation/Gait assistance: Min guard;Supervision Gait Distance (Feet): 200 Feet Assistive device: Rolling walker (2 wheeled) Gait Pattern/deviations: Step-through pattern;Decreased stride length     General Gait Details: min/guard to supervision for safety, cues for RW positioning and back precautions with turns  Stairs Stairs: Yes Stairs assistance: Min assist;Min guard Stair Management: Step to pattern;Forwards;Two  rails Number of Stairs: 6 General stair comments: cues for sequence  Wheelchair Mobility    Modified Rankin (Stroke Patients Only)       Balance Overall balance assessment: Needs assistance   Sitting balance-Leahy Scale: Fair       Standing balance-Leahy Scale: Poor Standing balance comment: reliant on UEs                             Pertinent Vitals/Pain Pain Assessment: 0-10 Pain Score: 3  Pain Location: back with transitions Pain Descriptors / Indicators: Grimacing;Sore Pain Intervention(s): Monitored during session;Limited activity within patient's tolerance;Premedicated before session    [place] expects to be discharged to:: Private residence Living Arrangements: Alone Available Help at Discharge: Other (Comment)(going to friend's home at D/C) Type of Home: House Home Access: Stairs to enter   [employer] of Steps: 6-8 at friend's home with bil hand rails   Home Equipment: Walker - 2 wheels;Bedside commode;Cane - single point      Prior Function Level of Independence: Independent               Hand Dominance        Extremity/Trunk Assessment   Upper Extremity Assessment Upper Extremity Assessment: Defer to OT evaluation    Lower Extremity Assessment Lower Extremity Assessment: Overall WFL for tasks assessed       Communication   Communication: No difficulties  Cognition Arousal/Alertness: Awake/alert Behavior During Therapy: WFL for tasks assessed/performed Overall Cognitive Status: Within Functional Limits for tasks assessed  General Comments      Exercises     Assessment/Plan    PT Assessment Patent does not need any further PT services  PT Problem List         PT Treatment Interventions      PT Goals (Current goals can be found in the Care Plan section)  Acute Rehab PT Goals PT Goal Formulation: All assessment and education  complete, DC therapy    Frequency     Barriers to discharge        Co-evaluation               AM-PAC PT "6 Clicks" Mobility  Outcome Measure Help needed turning from your back to your side while in a flat bed without using bedrails?: A Little Help needed moving from lying on your back to sitting on the side of a flat bed without using bedrails?: A Little Help needed moving to and from a bed to a chair (including a wheelchair)?: A Little Help needed standing up from a chair using your arms (e.g., wheelchair or bedside chair)?: A Little Help needed to walk in hospital room?: A Little Help needed climbing 3-5 steps with a railing? : A Little 6 Click Score: 18    End of Session Equipment Utilized During Treatment: Gait belt Activity Tolerance: Patient tolerated treatment well Patient left: in chair;with call bell/phone within reach;with chair alarm set Nurse Communication: Mobility status;Other (comment)(ready for d/c) PT Visit Diagnosis: Difficulty in walking, not elsewhere classified (R26.2)    Time: 9924-2683 PT Time Calculation (min) (ACUTE ONLY): 25 min   Charges:   PT Evaluation $PT Eval Low Complexity: 1 Low PT Treatments $Gait Training: 8-22 mins        Kenyon Ana, PT  Pager: [phone] Acute Rehab Dept [facility]): [phone]   05/06/2018   [facility] 05/06/2018, 10:50 AM

## 2018-05-09 NOTE — Discharge Summary (Signed)
Physician Discharge Summary   Patient ID: SENDER RUEB MRN: [id number] DOB/AGE: [birth year] 76 y.o.  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 Arthritis    oa  . Cancer ([place])    skin cancer area removed basal cell  . Coronary artery disease   . Diabetes mellitus without complication ([place])   . Gallstones   . GERD (gastroesophageal reflux disease)   . History of kidney stones yrs ago  . Hyperlipidemia   . Hypertension    Discharge Diagnoses:   Active Problems:   Spinal stenosis, lumbar region with neurogenic claudication  Procedure:  Procedure(s) (LRB): Lumbar decompression L3-L4 and L4-L5 for Spinal Stenosis and a Complete Block  and Bilateral Foraminotomies at Two Levels,for the L-4. and L-5 Nerve Roots for Foraminal Stenosis. (N/A)   Consults: None  HPI: The patient presented to the office with the chief complaint of low back pain. He has been dealing with this since the beginning of the year. He denies injury. He reports right leg pain and weakness as well. No improvement with conservative treatments. CT myelogram showed spinal stenosis L3-L4, L4-L5.      Laboratory Data: Hospital Outpatient Visit on 04/29/2018  Component Date Value Ref Range Status  . aPTT 04/29/2018 30  24 - 36 seconds Final   Performed at [facility], [place] [address] [place], [place] [address]  . WBC 04/29/2018 11.6* 4.0 - 10.5 K/uL Final  . RBC 04/29/2018 4.43  4.22 - 5.81 MIL/uL Final  . Hemoglobin 04/29/2018 13.4  13.0 - 17.0 g/dL Final  . HCT 04/29/2018 42.6  39.0 - 52.0 % Final  . MCV 04/29/2018 96.2  80.0 - 100.0 fL Final  . MCH 04/29/2018 30.2  26.0 - 34.0 pg Final  . MCHC 04/29/2018 31.5  30.0 - 36.0 g/dL Final  . RDW 04/29/2018 16.8* 11.5 - 15.5 % Final  . Platelets 04/29/2018 212  150 - 400 K/uL Final  . nRBC 04/29/2018 0.0   0.0 - 0.2 % Final  . Neutrophils Relative % 04/29/2018 60  % Final  . Neutro Abs 04/29/2018 7.1  1.7 - 7.7 K/uL Final  . Lymphocytes Relative 04/29/2018 16  % Final  . Lymphs Abs 04/29/2018 1.8  0.7 - 4.0 K/uL Final  . Monocytes Relative 04/29/2018 16  % Final  . Monocytes Absolute 04/29/2018 1.9* 0.1 - 1.0 K/uL Final  . Eosinophils Relative 04/29/2018 1  % Final  . Eosinophils Absolute 04/29/2018 0.1  0.0 - 0.5 K/uL Final  . Basophils Relative 04/29/2018 1  % Final  . Basophils Absolute 04/29/2018 0.1  0.0 - 0.1 K/uL Final  . WBC Morphology 04/29/2018 MORPHOLOGY UNREMARKABLE   Final  . Immature Granulocytes 04/29/2018 6  % Final  . Abs Immature Granulocytes 04/29/2018 0.69* 0.00 - 0.07 K/uL Final   Performed at [facility], [place] [address], [place], [place] [address]  . Sodium 04/29/2018 138  135 - 145 mmol/L Final  . Potassium 04/29/2018 4.7  3.5 - 5.1 mmol/L Final  . Chloride 04/29/2018 104  98 - 111 mmol/L Final  . CO2 04/29/2018 24  22 - 32 mmol/L Final  . Glucose, Bld 04/29/2018 110* 70 - 99 mg/dL Final  . BUN 04/29/2018 10  8 - 23 mg/dL Final  . Creatinine, Ser 04/29/2018 0.58* 0.61 - 1.24 mg/dL Final  . Calcium 04/29/2018 9.7  8.9 - 10.3 mg/dL Final  . Total Protein 04/29/2018 7.6  6.5 - 8.1 g/dL Final  . Albumin 04/29/2018 4.5  3.5 - 5.0 g/dL Final  . AST 04/29/2018 18  15 - 41 U/L Final  . ALT 04/29/2018 18  0 - 44 U/L Final  . Alkaline Phosphatase 04/29/2018 64  38 - 126 U/L Final  . Total Bilirubin 04/29/2018 1.1  0.3 - 1.2 mg/dL Final  . GFR calc non Af Amer 04/29/2018 >60  >60 mL/min Final  . GFR calc Af Amer 04/29/2018 >60  >60 mL/min Final  . Anion gap 04/29/2018 10  5 - 15 Final   Performed at [facility] [place] [address], [place], [place] [address]  . Prothrombin Time 04/29/2018 12.8  11.4 - 15.2 seconds Final  . INR 04/29/2018 1.0  0.8 - 1.2 Final   Comment: (NOTE) INR goal varies based on device and disease states.  Performed at [facility], [place] [address] [place], [place] [address]   . MRSA, PCR 04/29/2018 NEGATIVE  NEGATIVE Final  . Staphylococcus aureus 04/29/2018 NEGATIVE  NEGATIVE Final   Comment: (NOTE) The Xpert SA Assay (FDA approved for NASAL specimens in patients 37 years of age and older), is one component of a comprehensive surveillance program. It is not intended to diagnose infection nor to guide or monitor treatment. Performed at [facility], [place] [address] [place], [place] [address]   . Hgb A1c MFr Bld 04/29/2018 7.3* 4.8 - 5.6 % Final   Comment: (NOTE) Pre diabetes:          5.7%-6.4% Diabetes:              >6.4% Glycemic control for   <7.0% adults with diabetes   . Mean Plasma Glucose 04/29/2018 162.81  mg/dL Final   Performed at [place] [address] [place], [place] [address]  . Glucose-Capillary 04/29/2018 120* 70 - 99 mg/dL Final    X-Rays:Dg Lumbar Spine 2-3 Views  Result Date: 04/30/2018 CLINICAL DATA:  Preoperative evaluation for lumbar spine surgery EXAM: LUMBAR SPINE - 2-3 VIEW COMPARISON:  CT myelogram lumbar spine 04/09/2018 FINDINGS: CT lumbar myelography exam was labeled with 5 lumbar vertebra. Current radiographic exam labeled similarly. Osseous demineralization. Disc space narrowing and bulky endplate spur formation at L3-L4, L4-L5 and L5-S1. Small endplate spurs at superior endplates of L1, L2, and L3. Vertebral body heights maintained without fracture or subluxation. Facet degenerative changes lower lumbar spine. SI joints preserved. Calcifications in the RIGHT mid abdomen most consistent with cholelithiasis. IMPRESSION: Degenerative disc and facet disease changes of the lumbar spine as above. Electronically Signed   By: Lavonia Dana M.D.   On: 04/30/2018 08:21   Dg Spine Portable 1 View  Result Date: 05/05/2018 CLINICAL DATA:  Lumbar spine level localization needed. EXAM: PORTABLE SPINE - 1 VIEW COMPARISON:   CT of the lumbar spine 04/09/2018 FINDINGS: Using the same numbering scheme as 04/09/2018 the lumbar vertebral bodies have been annotated. There are tissue spreaders identified posterior to L3 and L4. Two surgical probes are also noted which localize the L3 and L4 pedicles. IMPRESSION: 1. The lumbar spine levels have been annotated as described above 2. Surgical probes localize the L3 and L4 vertebral pedicles. Electronically Signed   By: Kerby Moors M.D.   On: 05/05/2018 11:50   Dg Spine Portable 1 View  Result Date: 05/05/2018 CLINICAL DATA:  Intraoperative localization film for patient undergoing lumbar surgery. EXAM: PORTABLE  SPINE - 1 VIEW COMPARISON:  None. FINDINGS: Single probe is identified at the level of the L3 spinous process directed toward the L3-4 disc interspace. IMPRESSION: As above. Electronically Signed   By: Inge Rise M.D.   On: 05/05/2018 11:28   Dg Spine Portable 1 View  Result Date: 05/05/2018 CLINICAL DATA:  Intraoperative localization film for patient undergoing lumbar surgery. EXAM: PORTABLE SPINE - 1 VIEW COMPARISON:  Plain films lumbar spine 04/29/2018. FINDINGS: Two probes are identified. The more superior is directed toward the L3 pedicles and the more inferior is directed toward the L4 pedicles. IMPRESSION: As above. Electronically Signed   By: Inge Rise M.D.   On: 05/05/2018 11:22    EKG: Orders placed or performed during the hospital encounter of 04/29/18  . EKG 12-Lead  . EKG 12-Lead     Hospital Course: Patient was admitted to [facility] and taken to the OR and underwent the above state procedure without complications.  Patient tolerated the procedure well and was later transferred to the recovery room and then to the orthopaedic floor for postoperative care.  They were given PO and IV analgesics for pain control following their surgery.  They were given 24 hours of postoperative antibiotics.   PT was consulted postop to assist with  mobility and transfers.  The patient was allowed to be WBAT with therapy and was taught back precautions. Discharge planning was consulted to help with postop disposition and equipment needs.  Patient had a good night on the evening of surgery and started to get up OOB with therapy on day one. Patient was seen in rounds and was ready to go home on day one.  They were given discharge instructions and dressing directions.  They were instructed on when to follow up in the office with Dr. Gladstone Lighter.   Diet: Cardiac diet Activity:WBAT Follow-up:in 2 weeks Disposition - Home Discharged Condition: stable   Discharge Instructions    Call MD / Call 911   Complete by:  As directed    If you experience chest pain or shortness of breath, CALL 911 and be transported to the hospital emergency room.  If you develope a fever above 101 F, pus (white drainage) or increased drainage or redness at the wound, or calf pain, call your surgeon's office.   Constipation Prevention   Complete by:  As directed    Drink plenty of fluids.  Prune juice may be helpful.  You may use a stool softener, such as Colace (over the counter) 100 mg twice a day.  Use MiraLax (over the counter) for constipation as needed.   Diet - low sodium heart healthy   Complete by:  As directed    Increase activity slowly as tolerated   Complete by:  As directed      Allergies as of 05/06/2018   No Known Allergies     Medication List    TAKE these medications   aspirin 325 MG tablet Take 325 mg by mouth daily.   atorvastatin 40 MG tablet Commonly known as:  LIPITOR Take 40 mg by mouth every evening.   furosemide 40 MG tablet Commonly known as:  LASIX Take 40 mg by mouth daily as needed for edema.   Garlic 2563 MG Caps Take 3,000 mg by mouth daily.   HumaLOG KwikPen 100 UNIT/ML KwikPen Generic drug:  insulin lispro Inject 15 Units into the skin as needed (for high blood sugar).   HYDROcodone-acetaminophen 5-325 MG tablet  Commonly known  as:  NORCO/VICODIN Take 1 tablet by mouth every 4 (four) hours as needed for moderate pain ((score 4 to 6)).   Levemir FlexPen 100 UNIT/ML Pen Generic drug:  Insulin Detemir Inject 45 Units into the skin 2 (two) times daily.   lisinopril 40 MG tablet Commonly known as:  PRINIVIL,ZESTRIL Take 40 mg by mouth daily.   metFORMIN 1000 MG tablet Commonly known as:  GLUCOPHAGE Take 1,000 mg by mouth 2 (two) times daily with a meal.   methocarbamol 500 MG tablet Commonly known as:  ROBAXIN Take 1 tablet (500 mg total) by mouth every 6 (six) hours as needed for muscle spasms.   metoprolol tartrate 25 MG tablet Commonly known as:  LOPRESSOR Take 25 mg by mouth 2 (two) times daily.   MULTIVITAMIN PO Take 1 tablet by mouth daily.   omeprazole 40 MG capsule Commonly known as:  PRILOSEC Take 40 mg by mouth daily.   Vitamin D 50 MCG (2000 UT) Caps Take 2,000 Units by mouth daily.      Follow-up Information    Latanya Maudlin, MD. Schedule an appointment as soon as possible for a visit on 05/20/2018.   Specialty:  Orthopedic Surgery Contact information: [address] [place] [address] [phone]           Signed: Ardeen Jourdain, PA-C Orthopaedic Surgery 05/09/2018, 5:22 PM

## 2020-03-04 IMAGING — XA DG MYELOGRAPHY LUMBAR INJ LUMBOSACRAL
13 of 19 series · 13 of 19 positions shown · non-contrast
Comparison: None

CLINICAL DATA: Low back pain.  RIGHT buttock pain.
TECHNIQUE: Contiguous axial images were obtained through the Lumbar spine after
the intrathecal infusion of infusion. Coronal and sagittal
reconstructions were obtained of the axial image sets.

[Series 1: vasc standard · 1 of 1 slices shown (1 of 11)]
[im 1/1]
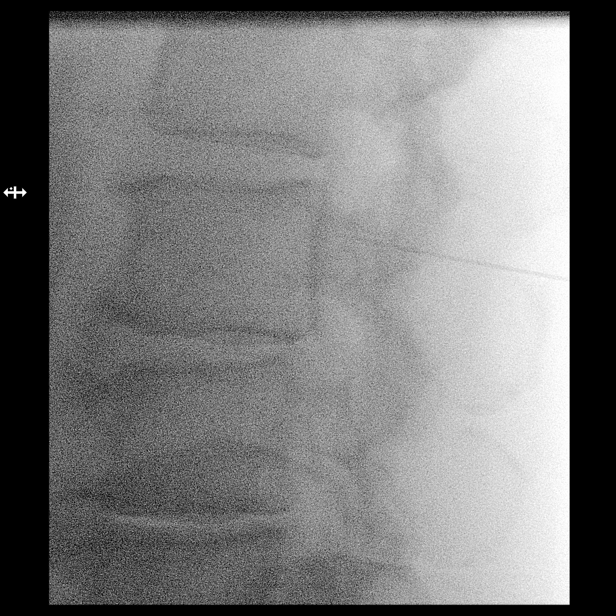

[Series 2: w lumbar spine lat · 0.15mm/px · 1 of 1 slices shown]
[im 1/1]
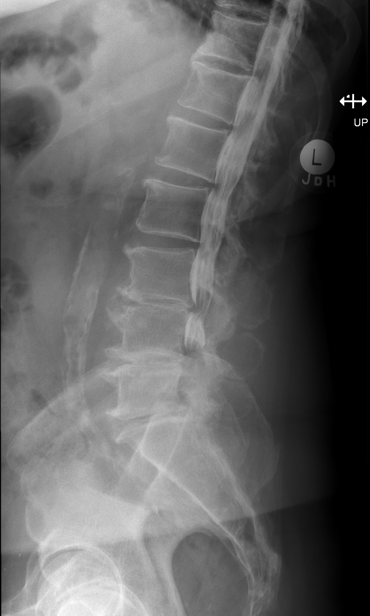

[Series 2: vasc standard · 1 of 1 slices shown (2 of 11)]
[im 1/1]
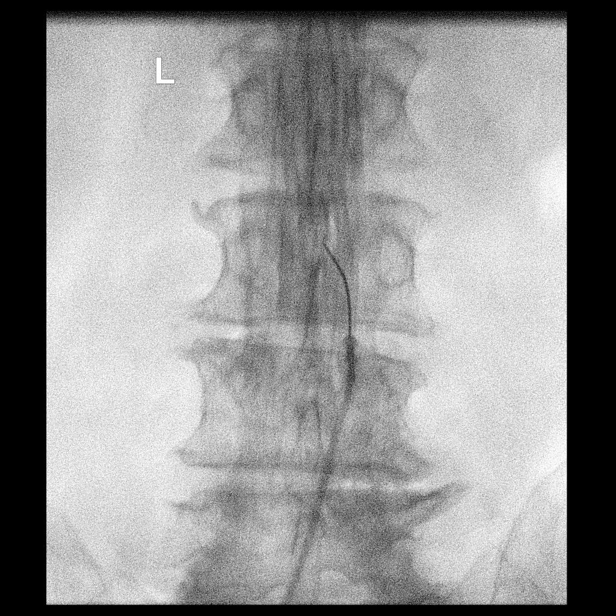

[Series 3: vasc standard · 1 of 1 slices shown (3 of 11)]
[im 1/1]
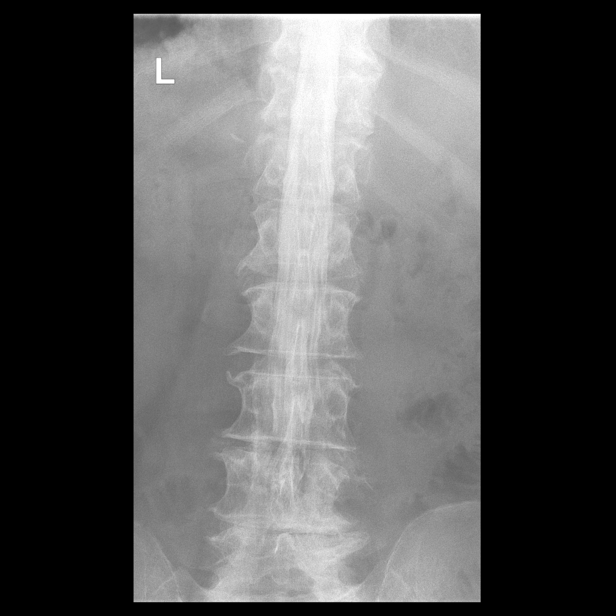

[Series 4: w lumbar spine extension · 0.15mm/px · 1 of 1 slices shown]
[im 1/1]
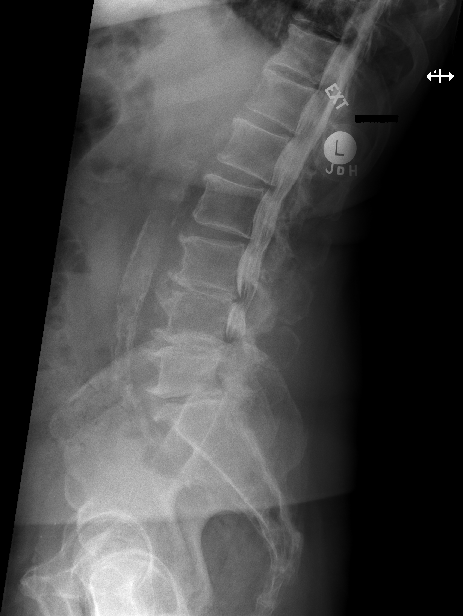

[Series 5: vasc standard · 1 of 1 slices shown (4 of 11)]
[im 1/1]
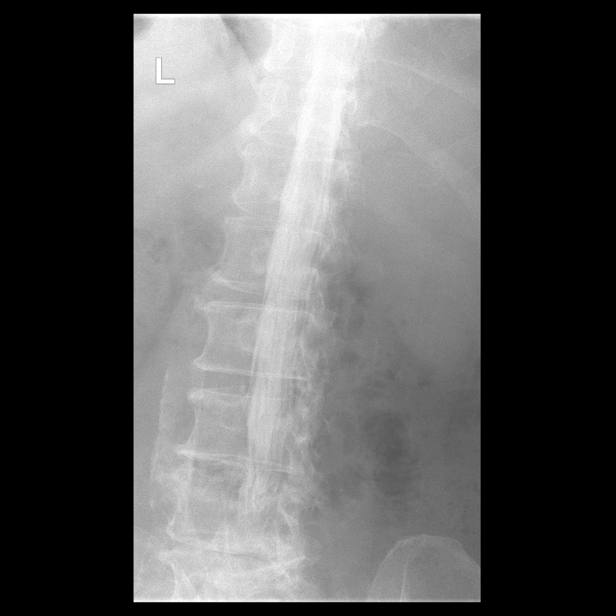

[Series 6: vasc standard · 1 of 1 slices shown (5 of 11)]
[im 1/1]
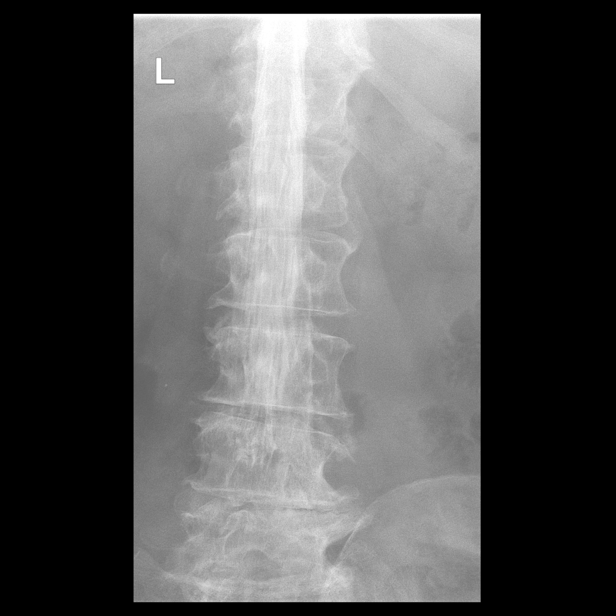

[Series 7: vasc standard · 1 of 1 slices shown (6 of 11)]
[im 1/1]
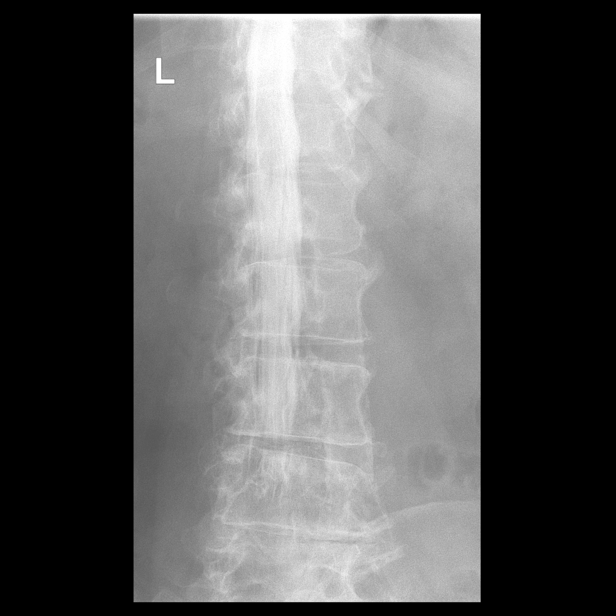

[Series 9: vasc standard · 1 of 1 slices shown (7 of 11)]
[im 1/1]
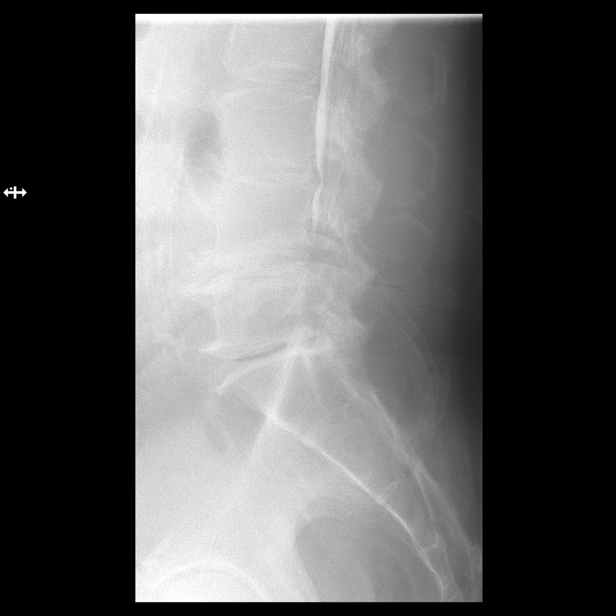

[Series 10: vasc standard · 1 of 1 slices shown (8 of 11)]
[im 1/1]
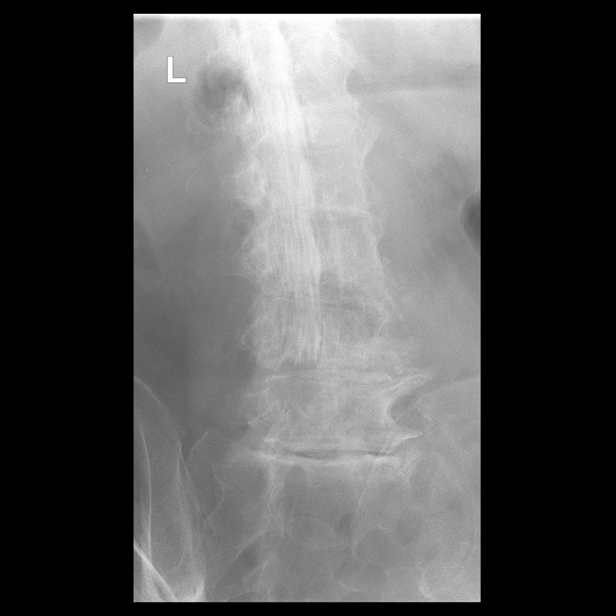

[Series 12: vasc standard · 1 of 1 slices shown (9 of 11)]
[im 1/1]
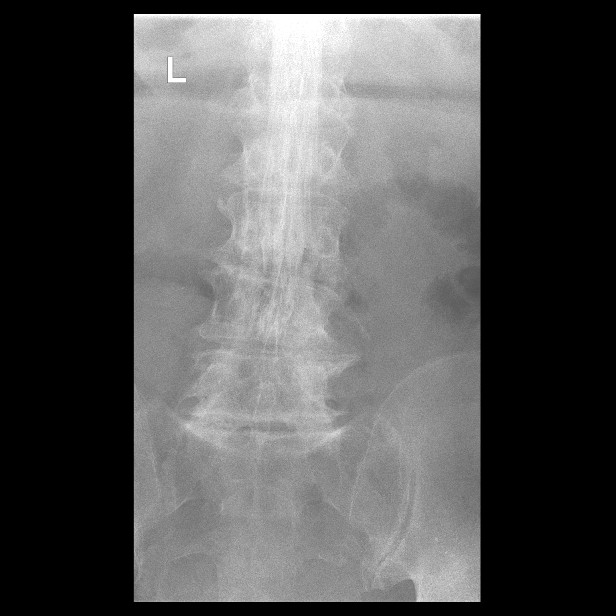

[Series 13: vasc standard · 1 of 1 slices shown (10 of 11)]
[im 1/1]
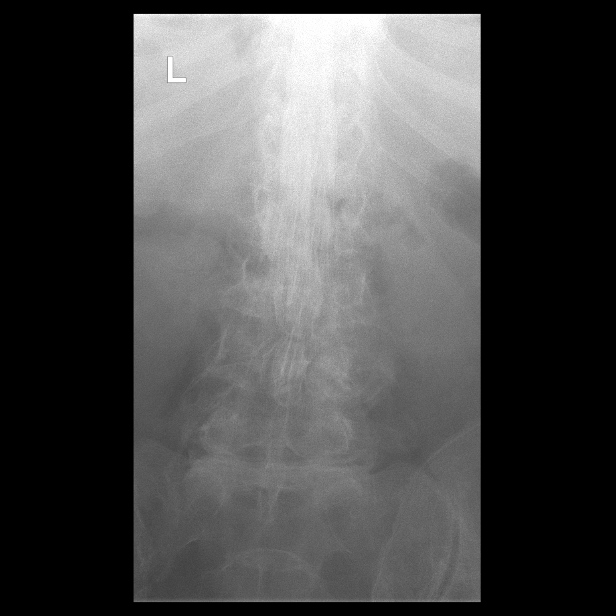

[Series 15: vasc standard · 1 of 1 slices shown (11 of 11)]
[im 1/1]
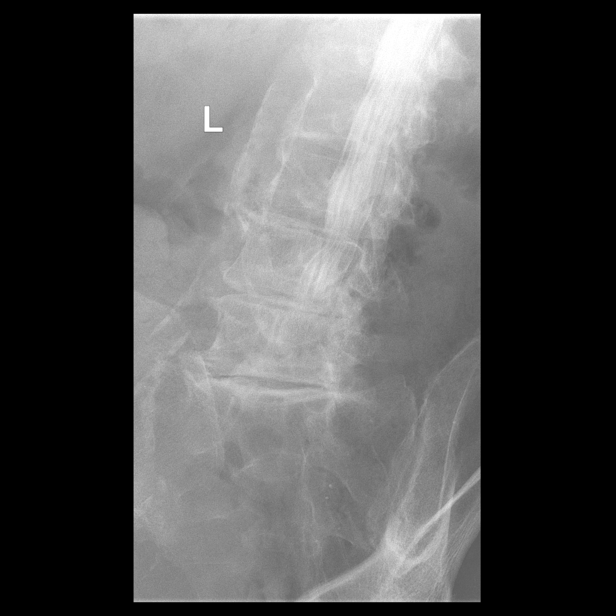

[13 of 19 positions shown; findings below may reference images not displayed]

EXAM:
LUMBAR MYELOGRAM

FLUOROSCOPY TIME:  33 seconds corresponding to a Dose Area Product
of 314.85 Gy*m2

PROCEDURE:
After thorough discussion of risks and benefits of the procedure
including bleeding, infection, injury to nerves, blood vessels,
adjacent structures as well as headache and CSF leak, written and
oral informed consent was obtained. Consent was obtained by Dr. Maywand
Boursiquot. Time out form was completed.

Patient was positioned prone on the fluoroscopy table. Local
anesthesia was provided with 1% lidocaine without epinephrine after
prepped and draped in the usual sterile fashion. Puncture was
performed at L2-L3 using a 3 1/2 inch 22-gauge spinal needle via
RIGHT paramedian approach. Using a single pass through the dura, the
needle was placed within the thecal sac, with return of clear CSF.
15 mL of Isovue [id number] was injected into the thecal sac, with normal
opacification of the nerve roots and cauda equina consistent with
free flow within the subarachnoid space.

I personally performed the lumbar puncture and administered the
intrathecal contrast. I also personally supervised acquisition of
the myelogram images.
FINDINGS: LUMBAR MYELOGRAM FINDINGS:

Good opacification of the lumbar and lower thoracic subarachnoid
space. There is advanced disc space narrowing at L4-5, greater on
the RIGHT with moderate disc space narrowing at L3-4 and L5-S1.
Severe stenosis L3-4, BILATERAL LEFT greater than RIGHT L4 nerve
root cut off. Complete block at L4-5, no significant caudal spread
of intrathecal contrast. No assessment therefore of the L5-S1
interspace is possible. No stenosis at L1-2 or L2-3.

With patient standing, anatomic alignment is maintained. No dynamic
instability is seen. Despite upright positioning, there remains a
complete block at L4-5.

CT LUMBAR MYELOGRAM FINDINGS:

Segmentation: Normal.

Alignment:  Normal.

Vertebrae: No worrisome osseous lesion.

Conus medullaris: Normal in size and location, terminating L1.

Paraspinal tissues: Aortic atherosclerosis. Tiny LEFT renal
calcification, likely nonobstructing stone versus vascular
calcification.

Disc levels:

L1-L2: Annular bulge. Osseous spurring. Facet arthropathy. No
impingement.

L2-L3: Annular bulge. Facet arthropathy. Osseous spurring. No
impingement.

L3-L4: Disc space narrowing. Central protrusion with osseous
spurring. Posterior element hypertrophy. Severe stenosis. LEFT
greater than RIGHT L4 and L3 nerve root impingement is likely.

L4-L5: Disc space narrowing. Osseous spurring. Vacuum phenomenon
centrally indicating disc protrusion. Posterior element hypertrophy.
No intrathecal contrast due to block. BILATERAL L4 and L5 neural
impingement are likely.

L5-S1: No intrathecal contrast due to block. Disc space narrowing.
Osseous spurring/calcified protrusion. Facet arthropathy. BILATERAL
L5 and S1 neural impingement are likely.
IMPRESSION: LUMBAR MYELOGRAM IMPRESSION:

Complete block at L4-5.  Severe stenosis L3-4.

Anatomic alignment without dynamic instability.

CT LUMBAR MYELOGRAM IMPRESSION:

Severe stenosis L3-4 related to central protrusion with osseous
spurring, and posterior element hypertrophy with LEFT greater than
RIGHT L4 and L3 neural impingement.

No intrathecal contrast at L4-5 with complete block. Disc space
narrowing, osseous spurring, probable central protrusion and
posterior element hypertrophy resulting in BILATERAL L4 and L5
neural impingement.

Similar, no intrathecal contrast at L5-S1. Disc space narrowing,
osseous spurring, probable central protrusion and posterior element
hypertrophy resulting in BILATERAL L5 and S1 neural impingement.

Aortic Atherosclerosis ([id number]).

## 2020-03-30 IMAGING — DX PORTABLE SPINE - 1 VIEW
1 series · 1 of 1 positions shown · non-contrast
Comparison: None.

CLINICAL DATA: Intraoperative localization film for patient
undergoing lumbar surgery.

EXAM:
PORTABLE SPINE - 1 VIEW

[l-spine lat]
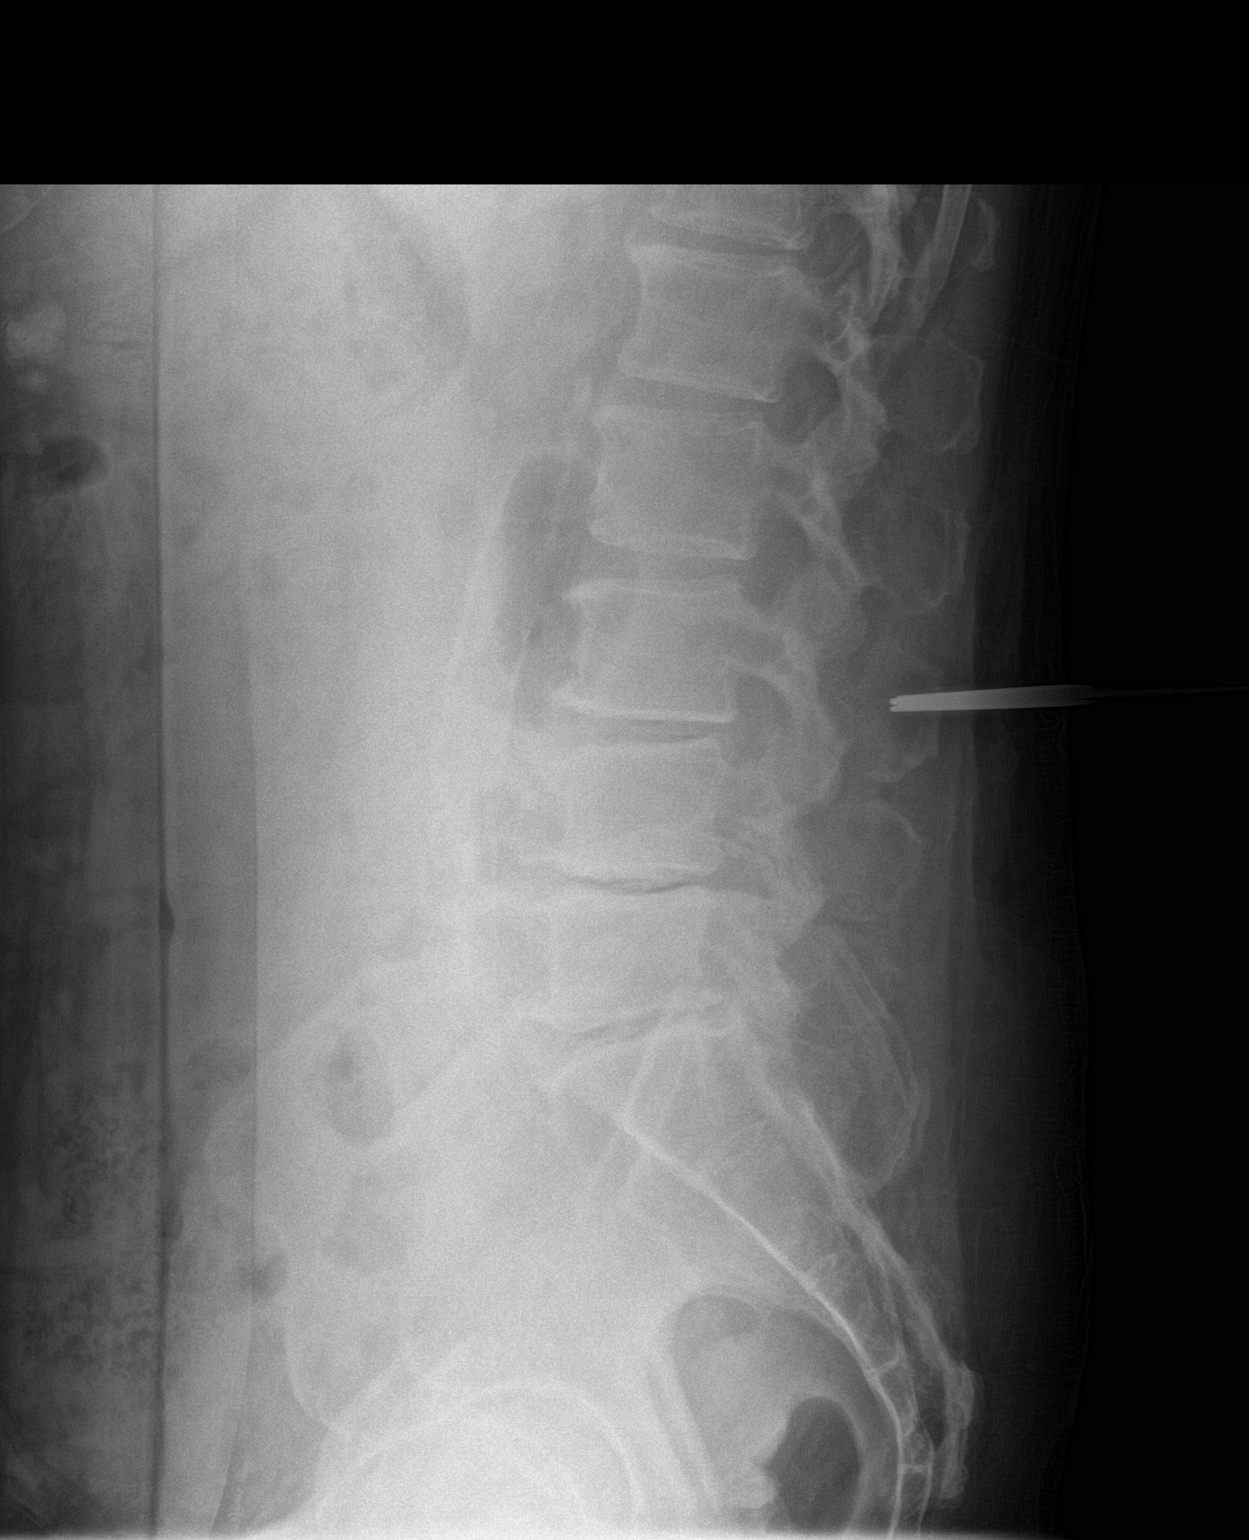

[1 of 1 positions shown; findings below may reference images not displayed]

FINDINGS: Single probe is identified at the level of the L3 spinous process
directed toward the L3-4 disc interspace.
IMPRESSION: As above.

## 2020-03-30 IMAGING — DX PORTABLE SPINE - 1 VIEW
1 series · 1 of 1 positions shown · non-contrast
Comparison: Plain films lumbar spine 04/29/2018.

CLINICAL DATA: Intraoperative localization film for patient
undergoing lumbar surgery.

EXAM:
PORTABLE SPINE - 1 VIEW

[l-spine lat]
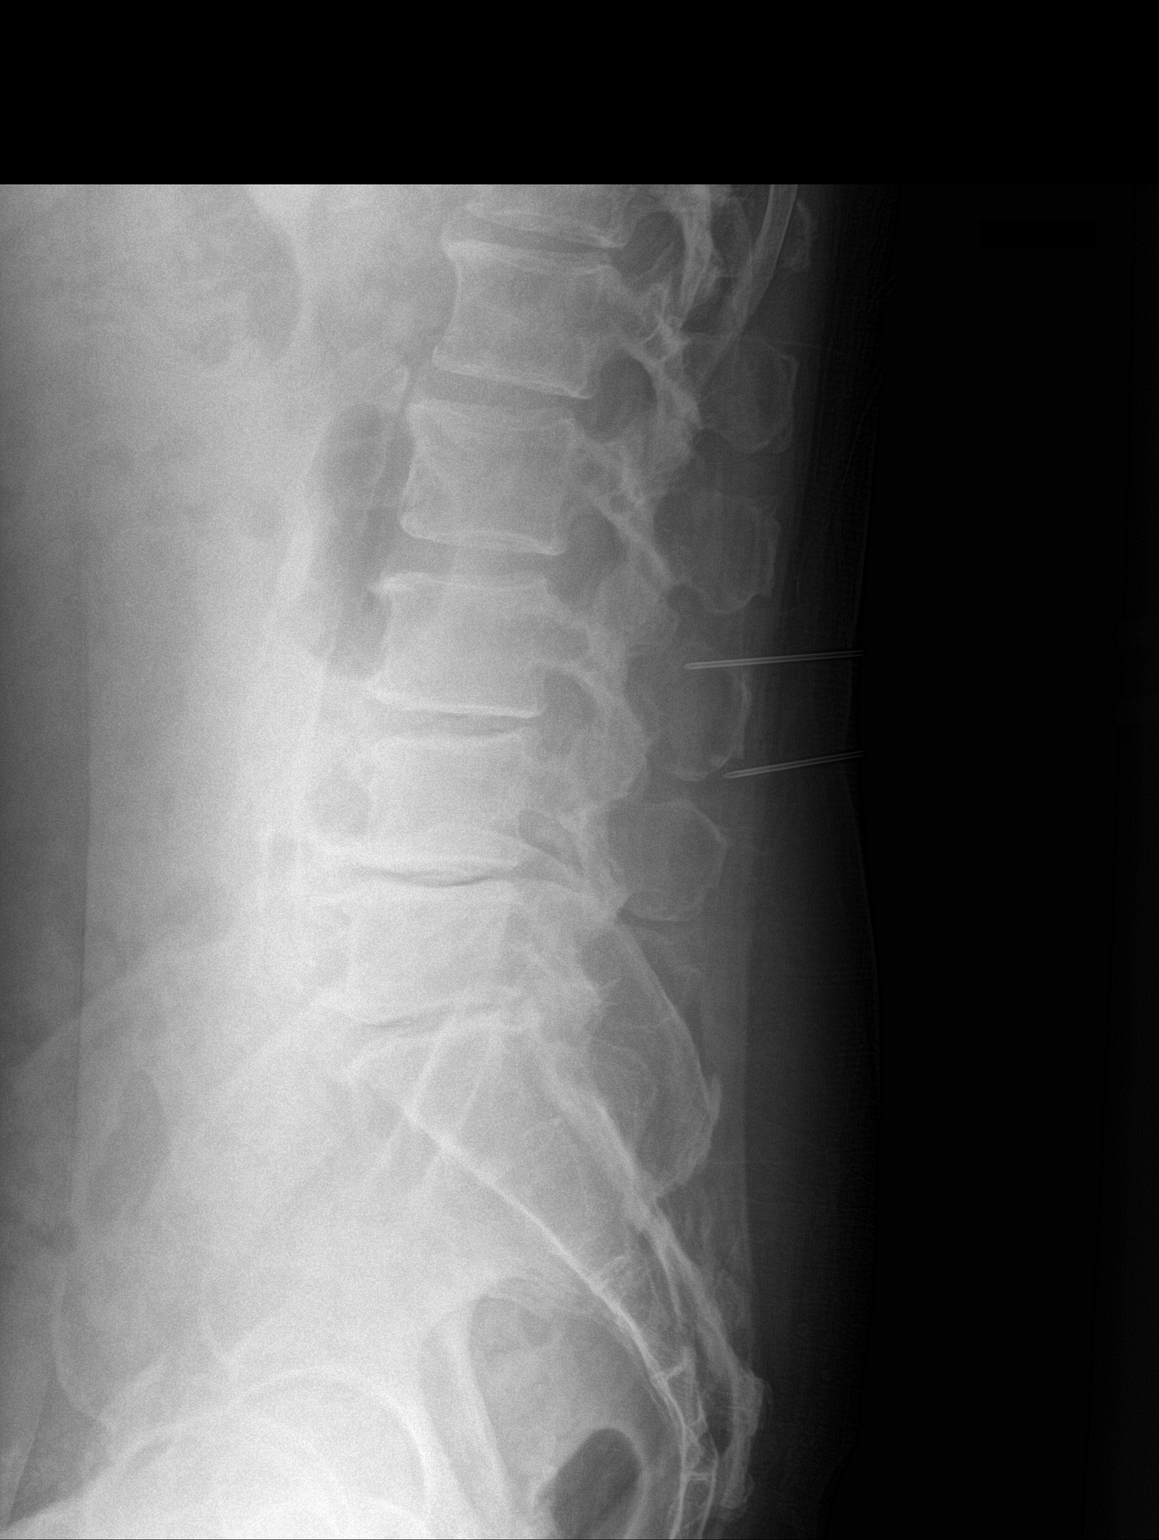

[1 of 1 positions shown; findings below may reference images not displayed]

FINDINGS: Two probes are identified. The more superior is directed toward the
L3 pedicles and the more inferior is directed toward the L4
pedicles.
IMPRESSION: As above.
